# Patient Record
Sex: Female | Born: 1935 | Race: White | Hispanic: No | Smoking: Never smoker
Health system: Southern US, Community
[De-identification: ages and names within clinical notes are randomized; demographics above are authoritative.]

## PROBLEM LIST (undated history)

## (undated) DIAGNOSIS — M1990 Unspecified osteoarthritis, unspecified site: Secondary | ICD-10-CM

## (undated) DIAGNOSIS — I480 Paroxysmal atrial fibrillation: Secondary | ICD-10-CM

## (undated) DIAGNOSIS — D649 Anemia, unspecified: Secondary | ICD-10-CM

## (undated) DIAGNOSIS — I4891 Unspecified atrial fibrillation: Secondary | ICD-10-CM

## (undated) DIAGNOSIS — K219 Gastro-esophageal reflux disease without esophagitis: Secondary | ICD-10-CM

## (undated) HISTORY — DX: Anemia, unspecified: D64.9

## (undated) HISTORY — DX: Unspecified atrial fibrillation: I48.91

## (undated) HISTORY — DX: Paroxysmal atrial fibrillation: I48.0

## (undated) HISTORY — PX: TONSILLECTOMY: SUR1361

## (undated) HISTORY — DX: Gastro-esophageal reflux disease without esophagitis: K21.9

## (undated) HISTORY — DX: Unspecified osteoarthritis, unspecified site: M19.90

## (undated) HISTORY — PX: ABDOMINAL HYSTERECTOMY: SHX81

---

## 2015-02-12 ENCOUNTER — Encounter (INDEPENDENT_AMBULATORY_CARE_PROVIDER_SITE_OTHER): Payer: Self-pay | Admitting: Ophthalmology

## 2015-02-16 ENCOUNTER — Encounter (INDEPENDENT_AMBULATORY_CARE_PROVIDER_SITE_OTHER): Payer: Medicare Other | Admitting: Ophthalmology

## 2015-02-16 DIAGNOSIS — H43813 Vitreous degeneration, bilateral: Secondary | ICD-10-CM | POA: Diagnosis not present

## 2015-02-16 DIAGNOSIS — H3531 Nonexudative age-related macular degeneration: Secondary | ICD-10-CM

## 2015-12-29 NOTE — Patient Instructions (Signed)
Joyce Hogan  12/29/2015     @PREFPERIOPPHARMACY @   Your procedure is scheduled on 01/04/2016.  Report to [facility] at 12:00 P.M.  Call this number if you have problems the morning of surgery:  [phone]   Remember:  Do not eat food or drink liquids after midnight.  Take these medicines the morning of surgery with A SIP OF WATER    Do not wear jewelry, make-up or nail polish.  Do not wear lotions, powders, or perfumes.  You may wear deodorant.  Do not shave 48 hours prior to surgery.  Men may shave face and neck.  Do not bring valuables to the hospital.  [facility] is not responsible for any belongings or valuables.  Contacts, dentures or bridgework may not be worn into surgery.  Leave your suitcase in the car.  After surgery it may be brought to your room.  For patients admitted to the hospital, discharge time will be determined by your treatment team.  Patients discharged the day of surgery will not be allowed to drive home.    Please read over the following fact sheets that you were given. Anesthesia Post-op Instructions     PATIENT INSTRUCTIONS POST-ANESTHESIA  IMMEDIATELY FOLLOWING SURGERY:  Do not drive or operate machinery for the first twenty four hours after surgery.  Do not make any important decisions for twenty four hours after surgery or while taking narcotic pain medications or sedatives.  If you develop intractable nausea and vomiting or a severe headache please notify your doctor immediately.  FOLLOW-UP:  Please make an appointment with your surgeon as instructed. You do not need to follow up with anesthesia unless specifically instructed to do so.  WOUND CARE INSTRUCTIONS (if applicable):  Keep a dry clean dressing on the anesthesia/puncture wound site if there is drainage.  Once the wound has quit draining you may leave it open to air.  Generally you should leave the bandage intact for twenty four hours unless there is drainage.  If the epidural site  drains for more than 36-48 hours please call the anesthesia department.  QUESTIONS?:  Please feel free to call your physician or the hospital operator if you have any questions, and they will be happy to assist you.       A cataract is a clouding of the lens of the eye. When a lens becomes cloudy, vision is reduced based on the degree and nature of the clouding. Surgery may be needed to improve vision. Surgery removes the cloudy lens and usually replaces it with a substitute lens (intraocular lens, IOL). LET YOUR EYE DOCTOR KNOW ABOUT:  Allergies to food or medicine.  Medicines taken including herbs, eye drops, over-the-counter medicines, and creams.  Use of steroids (by mouth or creams).  Previous problems with anesthetics or numbing medicine.  History of bleeding problems or blood clots.  Previous surgery.  Other health problems, including diabetes and kidney problems.  Possibility of pregnancy, if this applies. RISKS AND COMPLICATIONS  Infection.  Inflammation of the eyeball (endophthalmitis) that can spread to both eyes (sympathetic ophthalmia).  Poor wound healing.  If an IOL is inserted, it can later fall out of proper position. This is very uncommon.  Clouding of the part of your eye that holds an IOL in place. This is called an "after-cataract." These are uncommon but easily treated. BEFORE THE PROCEDURE  Do not eat or drink anything except small amounts of water for 8 to 12 before your surgery, or as  directed by your caregiver.  Unless you are told otherwise, continue any eye drops you have been prescribed.  Talk to your primary caregiver about all other medicines that you take (both prescription and nonprescription). In some cases, you may need to stop or change medicines near the time of your surgery. This is most important if you are taking blood-thinning medicine.Do not stop medicines unless you are told to do so.  Arrange for someone to drive you to and from  the procedure.  Do not put contact lenses in either eye on the day of your surgery. PROCEDURE There is more than one method for safely removing a cataract. Your doctor can explain the differences and help determine which is best for you. Phacoemulsification surgery is the most common form of cataract surgery.  An injection is given behind the eye or eye drops are given to make this a painless procedure.  A small cut (incision) is made on the edge of the clear, dome-shaped surface that covers the front of the eye (cornea).  A tiny probe is painlessly inserted into the eye. This device gives off ultrasound waves that soften and break up the cloudy center of the lens. This makes it easier for the cloudy lens to be removed by suction.  An IOL may be implanted.  The normal lens of the eye is covered by a clear capsule. Part of that capsule is intentionally left in the eye to support the IOL.  Your surgeon may or may not use stitches to close the incision. There are other forms of cataract surgery that require a larger incision and stitches to close the eye. This approach is taken in cases where the doctor feels that the cataract cannot be easily removed using phacoemulsification. AFTER THE PROCEDURE  When an IOL is implanted, it does not need care. It becomes a permanent part of your eye and cannot be seen or felt.  Your doctor will schedule follow-up exams to check on your progress.  Review your other medicines with your doctor to see which can be resumed after surgery.  Use eye drops or take medicine as prescribed by your doctor.   This information is not intended to replace advice given to you by your health care provider. Make sure you discuss any questions you have with your health care provider.   Document Released: 09/01/2011 Document Revised: 10/03/2014 Document Reviewed: 09/01/2011 Elsevier Interactive Patient Education [employer].

## 2015-12-30 ENCOUNTER — Encounter (HOSPITAL_COMMUNITY)
Admission: RE | Admit: 2015-12-30 | Discharge: 2015-12-30 | Disposition: A | Discharge status: Home / Self Care | Payer: Medicare Other | Source: Ambulatory Visit | Attending: Ophthalmology | Admitting: Ophthalmology

## 2015-12-30 ENCOUNTER — Other Ambulatory Visit: Payer: Self-pay

## 2015-12-30 ENCOUNTER — Encounter (HOSPITAL_COMMUNITY): Payer: Self-pay

## 2015-12-30 DIAGNOSIS — I452 Bifascicular block: Secondary | ICD-10-CM | POA: Diagnosis not present

## 2015-12-30 DIAGNOSIS — I498 Other specified cardiac arrhythmias: Secondary | ICD-10-CM | POA: Insufficient documentation

## 2015-12-30 DIAGNOSIS — Z01818 Encounter for other preprocedural examination: Secondary | ICD-10-CM | POA: Diagnosis not present

## 2015-12-30 DIAGNOSIS — Z01812 Encounter for preprocedural laboratory examination: Secondary | ICD-10-CM | POA: Insufficient documentation

## 2015-12-30 DIAGNOSIS — H2511 Age-related nuclear cataract, right eye: Secondary | ICD-10-CM | POA: Diagnosis not present

## 2015-12-30 LAB — BASIC METABOLIC PANEL
ANION GAP: 8 (ref 5–15)
BUN: 15 mg/dL (ref 6–20)
CO2: 25 mmol/L (ref 22–32)
Calcium: 8.6 mg/dL — ABNORMAL LOW (ref 8.9–10.3)
Chloride: 106 mmol/L (ref 101–111)
Creatinine, Ser: 0.91 mg/dL (ref 0.44–1.00)
GFR calc Af Amer: >60 mL/min (ref >60)
GFR, EST NON AFRICAN AMERICAN: 55 mL/min — AB (ref >60)
GLUCOSE: 172 mg/dL — AB (ref 65–99)
POTASSIUM: 3.7 mmol/L (ref 3.5–5.1)
Sodium: 139 mmol/L (ref 135–145)

## 2015-12-30 LAB — CBC
HEMATOCRIT: 35.9 % — AB (ref 36.0–46.0)
Hemoglobin: 11.6 g/dL — ABNORMAL LOW (ref 12.0–15.0)
MCH: 30.3 pg (ref 26.0–34.0)
MCHC: 32.3 g/dL (ref 30.0–36.0)
MCV: 93.7 fL (ref 78.0–100.0)
PLATELETS: 219 10*3/uL (ref 150–400)
RBC: 3.83 MIL/uL — AB (ref 3.87–5.11)
RDW: 13.5 % (ref 11.5–15.5)
WBC: 6.4 10*3/uL (ref 4.0–10.5)

## 2015-12-30 NOTE — Pre-Procedure Instructions (Signed)
Patient given information to sign up for my chart at home. 

## 2015-12-31 NOTE — Pre-Procedure Instructions (Signed)
EKG shown to Dr Gonzalez. No orders given. 

## 2016-01-04 ENCOUNTER — Ambulatory Visit (HOSPITAL_COMMUNITY): Payer: Medicare Other | Admitting: Anesthesiology

## 2016-01-04 ENCOUNTER — Encounter (HOSPITAL_COMMUNITY): Payer: Self-pay | Admitting: *Deleted

## 2016-01-04 ENCOUNTER — Encounter (HOSPITAL_COMMUNITY)
Admission: RE | Disposition: A | Discharge status: Home / Self Care | Payer: Self-pay | Source: Ambulatory Visit | Attending: Ophthalmology

## 2016-01-04 ENCOUNTER — Ambulatory Visit (HOSPITAL_COMMUNITY)
Admission: RE | Admit: 2016-01-04 | Discharge: 2016-01-04 | Disposition: A | Discharge status: Home / Self Care | Payer: Medicare Other | Source: Ambulatory Visit | Attending: Ophthalmology | Admitting: Ophthalmology

## 2016-01-04 DIAGNOSIS — H2511 Age-related nuclear cataract, right eye: Secondary | ICD-10-CM | POA: Insufficient documentation

## 2016-01-04 HISTORY — PX: CATARACT EXTRACTION W/PHACO: SHX586

## 2016-01-04 SURGERY — PHACOEMULSIFICATION, CATARACT, WITH IOL INSERTION
Anesthesia: Monitor Anesthesia Care | Site: Eye | Laterality: Right

## 2016-01-04 MED ORDER — PROVISC 10 MG/ML IO SOLN
INTRAOCULAR | Status: DC | PRN
  Administered 2016-01-04: 0.85 mL via INTRAOCULAR

## 2016-01-04 MED ORDER — EPINEPHRINE HCL 1 MG/ML IJ SOLN
INTRAMUSCULAR | Status: AC
  Filled 2016-01-04: qty 1

## 2016-01-04 MED ORDER — LACTATED RINGERS IV SOLN
INTRAVENOUS | Status: DC
  Administered 2016-01-04: 1000 mL via INTRAVENOUS

## 2016-01-04 MED ORDER — PHENYLEPHRINE HCL 2.5 % OP SOLN
1.0000 [drp] | OPHTHALMIC | Status: AC
  Administered 2016-01-04 (×3): 1 [drp] via OPHTHALMIC

## 2016-01-04 MED ORDER — POVIDONE-IODINE 5 % OP SOLN
OPHTHALMIC | Status: DC | PRN
  Administered 2016-01-04: 1 via OPHTHALMIC

## 2016-01-04 MED ORDER — BSS IO SOLN
INTRAOCULAR | Status: DC | PRN
  Administered 2016-01-04: 15 mL

## 2016-01-04 MED ORDER — FENTANYL CITRATE (PF) 100 MCG/2ML IJ SOLN
INTRAMUSCULAR | Status: AC
  Filled 2016-01-04: qty 2

## 2016-01-04 MED ORDER — LIDOCAINE 3.5 % OP GEL OPTIME - NO CHARGE
OPHTHALMIC | Status: DC | PRN
  Administered 2016-01-04: 1 [drp] via OPHTHALMIC

## 2016-01-04 MED ORDER — TETRACAINE HCL 0.5 % OP SOLN
1.0000 [drp] | OPHTHALMIC | Status: AC
Start: 2016-01-04 — End: 2016-01-04
  Administered 2016-01-04 (×3): 1 [drp] via OPHTHALMIC

## 2016-01-04 MED ORDER — CYCLOPENTOLATE-PHENYLEPHRINE 0.2-1 % OP SOLN
1.0000 [drp] | OPHTHALMIC | Status: AC
  Administered 2016-01-04 (×3): 1 [drp] via OPHTHALMIC

## 2016-01-04 MED ORDER — NEOMYCIN-POLYMYXIN-DEXAMETH 3.5-10000-0.1 OP SUSP
OPHTHALMIC | Status: DC | PRN
  Administered 2016-01-04: 2 [drp] via OPHTHALMIC

## 2016-01-04 MED ORDER — MIDAZOLAM HCL 2 MG/2ML IJ SOLN
1.0000 mg | INTRAMUSCULAR | Status: DC | PRN
  Administered 2016-01-04: 2 mg via INTRAVENOUS
  Filled 2016-01-04: qty 2

## 2016-01-04 MED ORDER — EPINEPHRINE HCL 1 MG/ML IJ SOLN
INTRAOCULAR | Status: DC | PRN
  Administered 2016-01-04: 500 mL

## 2016-01-04 MED ORDER — LIDOCAINE HCL (PF) 1 % IJ SOLN
INTRAOCULAR | Status: DC | PRN
  Administered 2016-01-04: .4 mL via OPHTHALMIC

## 2016-01-04 MED ORDER — LIDOCAINE HCL 3.5 % OP GEL: 1.0000 "application " | Freq: Once | OPHTHALMIC | Status: DC

## 2016-01-04 MED ORDER — FENTANYL CITRATE (PF) 100 MCG/2ML IJ SOLN
25.0000 ug | Freq: Once | INTRAMUSCULAR | Status: AC
  Administered 2016-01-04: 25 ug via INTRAVENOUS

## 2016-01-04 SURGICAL SUPPLY — 23 items
CAPSULAR TENSION RING-AMO (OPHTHALMIC RELATED) IMPLANT
CLOTH BEACON ORANGE TIMEOUT ST (SAFETY) ×3 IMPLANT
EYE SHIELD UNIVERSAL CLEAR (GAUZE/BANDAGES/DRESSINGS) ×3 IMPLANT
GLOVE BIOGEL PI IND STRL 7.0 (GLOVE) ×1 IMPLANT
GLOVE BIOGEL PI IND STRL 7.5 (GLOVE) IMPLANT
GLOVE BIOGEL PI INDICATOR 7.0 (GLOVE) ×2
GLOVE BIOGEL PI INDICATOR 7.5 (GLOVE)
GLOVE EXAM NITRILE LRG STRL (GLOVE) IMPLANT
GLOVE EXAM NITRILE MD LF STRL (GLOVE) ×3 IMPLANT
KIT VITRECTOMY (OPHTHALMIC RELATED) IMPLANT
LENS IOL ACRYSOF IQ TORIC 15.0 ×3 IMPLANT
PAD ARMBOARD 7.5X6 YLW CONV (MISCELLANEOUS) ×3 IMPLANT
PROC W NO LENS (INTRAOCULAR LENS)
PROC W SPEC LENS (INTRAOCULAR LENS) ×3
PROCESS W NO LENS (INTRAOCULAR LENS) IMPLANT
PROCESS W SPEC LENS (INTRAOCULAR LENS) ×1 IMPLANT
RETRACTOR IRIS SIGHTPATH (OPHTHALMIC RELATED) IMPLANT
RING MALYGIN (MISCELLANEOUS) IMPLANT
SYRINGE LUER LOK 1CC (MISCELLANEOUS) ×3 IMPLANT
TAPE SURG TRANSPORE 1 IN (GAUZE/BANDAGES/DRESSINGS) ×1 IMPLANT
TAPE SURGICAL TRANSPORE 1 IN (GAUZE/BANDAGES/DRESSINGS) ×2
VISCOELASTIC ADDITIONAL (OPHTHALMIC RELATED) IMPLANT
WATER STERILE IRR 250ML POUR (IV SOLUTION) ×3 IMPLANT

## 2016-01-04 NOTE — Op Note (Signed)
Date of Admission: 01/04/2016  Date of Surgery: 01/04/2016  Pre-Op Dx: Cataract Right Eye  Post-Op Dx: Senile Nuclear Cataract Right Eye,  Dx Code H25.11, Astigmatism Right Eye, Dx Code H52.2  Surgeon: Tonny Branch, M.D.  Assistants: None  Anesthesia: Topical with MAC  Indications: Painless, progressive loss of vision with compromise of daily activities.  Surgery: Cataract Extraction with Intraocular lens Implant Right Eye  Discription: The patient had dilating drops and viscous lidocaine placed into the Right in the pre-op holding area. In the sitting position horizontal reference marks were made on the cornea.  After transfer to the operating room, a time out was performed. The patient was then prepped and draped. Beginning with a 24 degree blade a paracentesis port was made at the surgeon's 2 o'clock position. The anterior chamber was then filled with 1% non-preserved lidocaine. This was followed by filling the anterior chamber with Provisc. A 2.47mm keratome blade was used to make a clear cornea incision at the temporal limbus. A bent cystatome needle was used to create a continuous tear capsulotomy. Hydrodissection was performed with balanced salt solution on a Fine canula. The lens nucleus was then removed using the phacoemulsification handpiece. Residual cortex was removed with the I&A handpiece. The anterior chamber and capsular bag were refilled with Provisc. A posterior chamber intraocular lens was placed into the capsular bag with it's injector. Additional corneal marks were made on the 172/356 degree meridians.  The Provisc was then removed from the anterior chamber and capsular bag with the I&A handpiece. The implant was positioned with the Kuglan hook. Stromal hydration of the main incision and paracentesis port was performed with BSS on a Fine canula. The wounds were tested for leak which was negative. The patient tolerated the procedure well. There were no operative complications. The  patient was then transferred to the recovery room in stable condition.  Complications: None  Specimen: None  EBL: None  Prosthetic device: Abbott Technis Toric ZCT400, power23.0,  SN [id number].

## 2016-01-04 NOTE — Discharge Instructions (Signed)
Anesthesia, Adult, Care After °Refer to this sheet in the next few weeks. These instructions provide you with information on caring for yourself after your procedure. Your health care provider may also give you more specific instructions. Your treatment has been planned according to current medical practices, but problems sometimes occur. Call your health care provider if you have any problems or questions after your procedure. °WHAT TO EXPECT AFTER THE PROCEDURE °After the procedure, it is typical to experience: °· Sleepiness. °· Nausea and vomiting. °HOME CARE INSTRUCTIONS °· For the first 24 hours after general anesthesia: °¨ Have a responsible person with you. °¨ Do not drive a car. If you are alone, do not take public transportation. °¨ Do not drink alcohol. °¨ Do not take medicine that has not been prescribed by your health care provider. °¨ Do not sign important papers or make important decisions. °¨ You may resume a normal diet and activities as directed by your health care provider. °· Change bandages (dressings) as directed. °· If you have questions or problems that seem related to general anesthesia, call the hospital and ask for the anesthetist or anesthesiologist on call. °SEEK MEDICAL CARE IF: °· You have nausea and vomiting that continue the day after anesthesia. °· You develop a rash. °SEEK IMMEDIATE MEDICAL CARE IF:  °· You have difficulty breathing. °· You have chest pain. °· You have any allergic problems. °  °This information is not intended to replace advice given to you by your health care provider. Make sure you discuss any questions you have with your health care provider. °  °Document Released: 12/19/2000 Document Revised: 10/03/2014 Document Reviewed: 01/11/2012 °Elsevier Interactive Patient Education ©2016 Elsevier Inc. ° °

## 2016-01-04 NOTE — Anesthesia Postprocedure Evaluation (Signed)
Anesthesia Post Note  Patient: Joyce Hogan  Procedure(s) Performed: Procedure(s) (LRB): CATARACT EXTRACTION PHACO AND INTRAOCULAR LENS PLACEMENT (IOC) (Right)  Patient location during evaluation: Short Stay Anesthesia Type: MAC Level of consciousness: awake and alert and oriented Pain management: pain level controlled Respiratory status: spontaneous breathing, nonlabored ventilation and respiratory function stable Cardiovascular status: blood pressure returned to baseline Postop Assessment: no signs of nausea or vomiting and adequate PO intake Anesthetic complications: no    Last Vitals:  Filed Vitals:   01/04/16 0835 01/04/16 0840  BP: 128/60 141/67  Pulse:    Temp:    Resp: 16 15    Last Pain: There were no vitals filed for this visit.               Havanna Groner J

## 2016-01-04 NOTE — Anesthesia Preprocedure Evaluation (Signed)
Anesthesia Evaluation  Patient identified by MRN, date of birth, ID band Patient awake    Reviewed: Allergy & Precautions, NPO status , Patient's Chart, lab work & pertinent test results  Airway Mallampati: III  TM Distance: <3 FB     Dental  (+) Teeth Intact   Pulmonary neg pulmonary ROS,    breath sounds clear to auscultation       Cardiovascular negative cardio ROS   Rhythm:Regular Rate:Normal     Neuro/Psych    GI/Hepatic negative GI ROS,   Endo/Other    Renal/GU      Musculoskeletal   Abdominal   Peds  Hematology   Anesthesia Other Findings   Reproductive/Obstetrics                             Anesthesia Physical Anesthesia Plan  ASA: II  Anesthesia Plan: MAC   Post-op Pain Management:    Induction: Intravenous  Airway Management Planned: Nasal Cannula  Additional Equipment:   Intra-op Plan:   Post-operative Plan:   Informed Consent: I have reviewed the patients History and Physical, chart, labs and discussed the procedure including the risks, benefits and alternatives for the proposed anesthesia with the patient or authorized representative who has indicated his/her understanding and acceptance.     Plan Discussed with:   Anesthesia Plan Comments:         Anesthesia Quick Evaluation

## 2016-01-04 NOTE — Transfer of Care (Signed)
Immediate Anesthesia Transfer of Care Note  Patient: Joyce Hogan  Procedure(s) Performed: Procedure(s) with comments: CATARACT EXTRACTION PHACO AND INTRAOCULAR LENS PLACEMENT (IOC) (Right) - CDE 9.06  Patient Location: Short Stay  Anesthesia Type:MAC  Level of Consciousness: awake, alert , oriented and patient cooperative  Airway & Oxygen Therapy: Patient Spontanous Breathing  Post-op Assessment: Report given to RN, Post -op Vital signs reviewed and stable and Patient moving all extremities  Post vital signs: Reviewed and stable  Last Vitals:  Filed Vitals:   01/04/16 0835 01/04/16 0840  BP: 128/60 141/67  Pulse:    Temp:    Resp: 16 15    Complications: No apparent anesthesia complications

## 2016-01-04 NOTE — H&P (Signed)
I have reviewed the H&P, the patient was re-examined, and I have identified no interval changes in medical condition and plan of care since the history and physical of record  

## 2016-01-05 ENCOUNTER — Encounter (HOSPITAL_COMMUNITY): Payer: Self-pay | Admitting: Ophthalmology

## 2016-01-15 ENCOUNTER — Encounter (HOSPITAL_COMMUNITY): Payer: Self-pay

## 2016-01-15 ENCOUNTER — Encounter (HOSPITAL_COMMUNITY)
Admission: RE | Admit: 2016-01-15 | Discharge: 2016-01-15 | Disposition: A | Discharge status: Home / Self Care | Payer: Medicare Other | Source: Ambulatory Visit | Attending: Ophthalmology | Admitting: Ophthalmology

## 2016-01-21 ENCOUNTER — Encounter (HOSPITAL_COMMUNITY)
Admission: RE | Disposition: A | Discharge status: Home / Self Care | Payer: Self-pay | Source: Ambulatory Visit | Attending: Ophthalmology

## 2016-01-21 ENCOUNTER — Ambulatory Visit (HOSPITAL_COMMUNITY): Payer: Medicare Other | Admitting: Anesthesiology

## 2016-01-21 ENCOUNTER — Ambulatory Visit (HOSPITAL_COMMUNITY)
Admission: RE | Admit: 2016-01-21 | Discharge: 2016-01-21 | Disposition: A | Discharge status: Home / Self Care | Payer: Medicare Other | Source: Ambulatory Visit | Attending: Ophthalmology | Admitting: Ophthalmology

## 2016-01-21 ENCOUNTER — Encounter (HOSPITAL_COMMUNITY): Payer: Self-pay | Admitting: *Deleted

## 2016-01-21 DIAGNOSIS — H52202 Unspecified astigmatism, left eye: Secondary | ICD-10-CM | POA: Insufficient documentation

## 2016-01-21 DIAGNOSIS — H2512 Age-related nuclear cataract, left eye: Secondary | ICD-10-CM | POA: Diagnosis not present

## 2016-01-21 HISTORY — PX: CATARACT EXTRACTION W/PHACO: SHX586

## 2016-01-21 SURGERY — PHACOEMULSIFICATION, CATARACT, WITH IOL INSERTION
Anesthesia: Monitor Anesthesia Care | Site: Eye | Laterality: Left

## 2016-01-21 MED ORDER — LIDOCAINE 3.5 % OP GEL OPTIME - NO CHARGE
OPHTHALMIC | Status: DC | PRN
  Administered 2016-01-21: 1 [drp] via OPHTHALMIC

## 2016-01-21 MED ORDER — MIDAZOLAM HCL 2 MG/2ML IJ SOLN
1.0000 mg | INTRAMUSCULAR | Status: DC | PRN
  Administered 2016-01-21: 2 mg via INTRAVENOUS

## 2016-01-21 MED ORDER — POVIDONE-IODINE 5 % OP SOLN
OPHTHALMIC | Status: DC | PRN
  Administered 2016-01-21: 1 via OPHTHALMIC

## 2016-01-21 MED ORDER — TETRACAINE HCL 0.5 % OP SOLN
1.0000 [drp] | OPHTHALMIC | Status: AC
  Administered 2016-01-21 (×3): 1 [drp] via OPHTHALMIC

## 2016-01-21 MED ORDER — MIDAZOLAM HCL 2 MG/2ML IJ SOLN
INTRAMUSCULAR | Status: AC
  Filled 2016-01-21: qty 2

## 2016-01-21 MED ORDER — FENTANYL CITRATE (PF) 100 MCG/2ML IJ SOLN
25.0000 ug | INTRAMUSCULAR | Status: AC
  Administered 2016-01-21 (×2): 25 ug via INTRAVENOUS

## 2016-01-21 MED ORDER — BSS IO SOLN
INTRAOCULAR | Status: DC | PRN
  Administered 2016-01-21: 15 mL via INTRAOCULAR

## 2016-01-21 MED ORDER — LIDOCAINE HCL (PF) 1 % IJ SOLN
INTRAMUSCULAR | Status: DC | PRN
  Administered 2016-01-21: .7 mL

## 2016-01-21 MED ORDER — CYCLOPENTOLATE-PHENYLEPHRINE 0.2-1 % OP SOLN
1.0000 [drp] | OPHTHALMIC | Status: AC
  Administered 2016-01-21 (×2): 1 [drp] via OPHTHALMIC

## 2016-01-21 MED ORDER — PROVISC 10 MG/ML IO SOLN
INTRAOCULAR | Status: DC | PRN
  Administered 2016-01-21: 0.85 mL via INTRAOCULAR

## 2016-01-21 MED ORDER — LACTATED RINGERS IV SOLN
INTRAVENOUS | Status: DC
Start: 2016-01-21 — End: 2016-01-21
  Administered 2016-01-21: 09:00:00 via INTRAVENOUS

## 2016-01-21 MED ORDER — PHENYLEPHRINE HCL 2.5 % OP SOLN
1.0000 [drp] | OPHTHALMIC | Status: AC
  Administered 2016-01-21 (×3): 1 [drp] via OPHTHALMIC

## 2016-01-21 MED ORDER — EPINEPHRINE HCL 1 MG/ML IJ SOLN
INTRAOCULAR | Status: DC | PRN
  Administered 2016-01-21: 500 mL

## 2016-01-21 MED ORDER — FENTANYL CITRATE (PF) 100 MCG/2ML IJ SOLN
INTRAMUSCULAR | Status: AC
  Filled 2016-01-21: qty 2

## 2016-01-21 MED ORDER — EPINEPHRINE HCL 1 MG/ML IJ SOLN
INTRAMUSCULAR | Status: AC
  Filled 2016-01-21: qty 1

## 2016-01-21 MED ORDER — NEOMYCIN-POLYMYXIN-DEXAMETH 3.5-10000-0.1 OP SUSP
OPHTHALMIC | Status: DC | PRN
  Administered 2016-01-21: 2 [drp] via OPHTHALMIC

## 2016-01-21 MED ORDER — LIDOCAINE HCL 3.5 % OP GEL: 1.0000 "application " | Freq: Once | OPHTHALMIC | Status: DC

## 2016-01-21 SURGICAL SUPPLY — 23 items
CAPSULAR TENSION RING-AMO (OPHTHALMIC RELATED) IMPLANT
CLOTH BEACON ORANGE TIMEOUT ST (SAFETY) ×3 IMPLANT
EYE SHIELD UNIVERSAL CLEAR (GAUZE/BANDAGES/DRESSINGS) ×3 IMPLANT
GLOVE BIOGEL PI IND STRL 7.0 (GLOVE) ×1 IMPLANT
GLOVE BIOGEL PI IND STRL 7.5 (GLOVE) IMPLANT
GLOVE BIOGEL PI INDICATOR 7.0 (GLOVE) ×2
GLOVE BIOGEL PI INDICATOR 7.5 (GLOVE)
GLOVE EXAM NITRILE LRG STRL (GLOVE) IMPLANT
GLOVE EXAM NITRILE MD LF STRL (GLOVE) ×3 IMPLANT
KIT VITRECTOMY (OPHTHALMIC RELATED) IMPLANT
LENS IOL ACRYSOF IQ TORIC 15.0 ×3 IMPLANT
PAD ARMBOARD 7.5X6 YLW CONV (MISCELLANEOUS) ×3 IMPLANT
PROC W NO LENS (INTRAOCULAR LENS)
PROC W SPEC LENS (INTRAOCULAR LENS) ×3
PROCESS W NO LENS (INTRAOCULAR LENS) IMPLANT
PROCESS W SPEC LENS (INTRAOCULAR LENS) ×1 IMPLANT
RETRACTOR IRIS SIGHTPATH (OPHTHALMIC RELATED) IMPLANT
RING MALYGIN (MISCELLANEOUS) IMPLANT
SYRINGE LUER LOK 1CC (MISCELLANEOUS) ×3 IMPLANT
TAPE SURG TRANSPORE 1 IN (GAUZE/BANDAGES/DRESSINGS) ×1 IMPLANT
TAPE SURGICAL TRANSPORE 1 IN (GAUZE/BANDAGES/DRESSINGS) ×2
VISCOELASTIC ADDITIONAL (OPHTHALMIC RELATED) IMPLANT
WATER STERILE IRR 250ML POUR (IV SOLUTION) ×3 IMPLANT

## 2016-01-21 NOTE — Anesthesia Postprocedure Evaluation (Signed)
Anesthesia Post Note  Patient: Joyce Hogan  Procedure(s) Performed: Procedure(s) (LRB): CATARACT EXTRACTION PHACO AND INTRAOCULAR LENS PLACEMENT (IOC) (Left)  Patient location during evaluation: Short Stay Anesthesia Type: MAC Level of consciousness: awake and alert, oriented and patient cooperative Pain management: pain level controlled Vital Signs Assessment: post-procedure vital signs reviewed and stable Respiratory status: spontaneous breathing, nonlabored ventilation and respiratory function stable Cardiovascular status: blood pressure returned to baseline Postop Assessment: no signs of nausea or vomiting Anesthetic complications: no    Last Vitals:  Filed Vitals:   01/21/16 0930 01/21/16 0935  BP: 155/78 134/82  Resp: 30 18    Last Pain: There were no vitals filed for this visit.               Priyanka Causey J

## 2016-01-21 NOTE — Transfer of Care (Signed)
Immediate Anesthesia Transfer of Care Note  Patient: Joyce Hogan  Procedure(s) Performed: Procedure(s) with comments: CATARACT EXTRACTION PHACO AND INTRAOCULAR LENS PLACEMENT (IOC) (Left) - CDE: 12.65  Patient Location: Short Stay  Anesthesia Type:MAC  Level of Consciousness: awake, alert , oriented and patient cooperative  Airway & Oxygen Therapy: Patient Spontanous Breathing  Post-op Assessment: Report given to RN, Post -op Vital signs reviewed and stable and Patient moving all extremities  Post vital signs: Reviewed and stable  Last Vitals:  Filed Vitals:   01/21/16 0930 01/21/16 0935  BP: 155/78 134/82  Resp: 30 18    Last Pain: There were no vitals filed for this visit.    Patients Stated Pain Goal: 7 ([id number] [id number])  Complications: No apparent anesthesia complications

## 2016-01-21 NOTE — Anesthesia Preprocedure Evaluation (Signed)
Anesthesia Evaluation  Patient identified by MRN, date of birth, ID band Patient awake    Reviewed: Allergy & Precautions, NPO status , Patient's Chart, lab work & pertinent test results  Airway Mallampati: III  TM Distance: <3 FB     Dental  (+) Teeth Intact   Pulmonary neg pulmonary ROS,    breath sounds clear to auscultation       Cardiovascular negative cardio ROS   Rhythm:Regular Rate:Normal     Neuro/Psych    GI/Hepatic negative GI ROS,   Endo/Other    Renal/GU      Musculoskeletal   Abdominal   Peds  Hematology   Anesthesia Other Findings   Reproductive/Obstetrics                             Anesthesia Physical Anesthesia Plan  ASA: II  Anesthesia Plan: MAC   Post-op Pain Management:    Induction: Intravenous  Airway Management Planned: Nasal Cannula  Additional Equipment:   Intra-op Plan:   Post-operative Plan:   Informed Consent: I have reviewed the patients History and Physical, chart, labs and discussed the procedure including the risks, benefits and alternatives for the proposed anesthesia with the patient or authorized representative who has indicated his/her understanding and acceptance.     Plan Discussed with:   Anesthesia Plan Comments:         Anesthesia Quick Evaluation

## 2016-01-21 NOTE — H&P (Signed)
I have reviewed the H&P, the patient was re-examined, and I have identified no interval changes in medical condition and plan of care since the history and physical of record  

## 2016-01-21 NOTE — Discharge Instructions (Signed)

## 2016-01-21 NOTE — Op Note (Signed)
Date of Admission: 01/21/2016  Date of Surgery: 01/21/2016  Pre-Op Dx: Cataract Left Eye  Post-Op Dx: Senile Nuclear Cataract Left Eye,  Dx Code H25.12, Astigmatism Left Eye, Dx Code H52.2  Surgeon: Tonny Branch, M.D.  Assistants: None  Anesthesia: Topical with MAC  Indications: Painless, progressive loss of vision with compromise of daily activities.  Surgery: Cataract Extraction with Intraocular lens Implant Left Eye  Discription: The patient had dilating drops and viscous lidocaine placed into the Left in the pre-op holding area. In the sitting position horizontal reference marks were made on the cornea.  After transfer to the operating room, a time out was performed. The patient was then prepped and draped. Beginning with a 42 degree blade a paracentesis port was made at the surgeon's 2 o'clock position. The anterior chamber was then filled with 1% non-preserved lidocaine. This was followed by filling the anterior chamber with Provisc. A 2.45mm keratome blade was used to make a clear cornea incision at the temporal limbus. A bent cystatome needle was used to create a continuous tear capsulotomy. Hydrodissection was performed with balanced salt solution on a Fine canula. The lens nucleus was then removed using the phacoemulsification handpiece. Residual cortex was removed with the I&A handpiece. The anterior chamber and capsular bag were refilled with Provisc. A posterior chamber intraocular lens was placed into the capsular bag with it's injector. Additional corneal marks were made on the 167/347 degree meridians.  The Provisc was then removed from the anterior chamber and capsular bag with the I&A handpiece. The implant was positioned with the Kuglan hook. Stromal hydration of the main incision and paracentesis port was performed with BSS on a Fine canula. The wounds were tested for leak which was negative. The patient tolerated the procedure well. There were no operative complications. The patient  was then transferred to the recovery room in stable condition.  Complications: None  Specimen: None  EBL: None  Prosthetic device: Abbott Technis Toric ZCT300, power 23.0,  SN [id number].

## 2016-01-22 ENCOUNTER — Encounter (HOSPITAL_COMMUNITY): Payer: Self-pay | Admitting: Ophthalmology

## 2016-02-26 ENCOUNTER — Ambulatory Visit (INDEPENDENT_AMBULATORY_CARE_PROVIDER_SITE_OTHER): Payer: Medicare Other | Admitting: Ophthalmology

## 2016-02-26 DIAGNOSIS — H43813 Vitreous degeneration, bilateral: Secondary | ICD-10-CM

## 2016-02-26 DIAGNOSIS — H353134 Nonexudative age-related macular degeneration, bilateral, advanced atrophic with subfoveal involvement: Secondary | ICD-10-CM

## 2016-11-11 ENCOUNTER — Encounter (INDEPENDENT_AMBULATORY_CARE_PROVIDER_SITE_OTHER): Payer: Medicare Other | Admitting: Ophthalmology

## 2016-11-11 DIAGNOSIS — H353132 Nonexudative age-related macular degeneration, bilateral, intermediate dry stage: Secondary | ICD-10-CM | POA: Diagnosis not present

## 2016-11-11 DIAGNOSIS — H43813 Vitreous degeneration, bilateral: Secondary | ICD-10-CM

## 2016-11-11 DIAGNOSIS — H01002 Unspecified blepharitis right lower eyelid: Secondary | ICD-10-CM | POA: Diagnosis not present

## 2017-03-02 ENCOUNTER — Ambulatory Visit (INDEPENDENT_AMBULATORY_CARE_PROVIDER_SITE_OTHER): Payer: Medicare Other | Admitting: Ophthalmology

## 2017-07-31 DIAGNOSIS — L03032 Cellulitis of left toe: Secondary | ICD-10-CM | POA: Diagnosis not present

## 2017-07-31 DIAGNOSIS — L600 Ingrowing nail: Secondary | ICD-10-CM | POA: Diagnosis not present

## 2017-07-31 DIAGNOSIS — I739 Peripheral vascular disease, unspecified: Secondary | ICD-10-CM | POA: Diagnosis not present

## 2017-07-31 DIAGNOSIS — L03031 Cellulitis of right toe: Secondary | ICD-10-CM | POA: Diagnosis not present

## 2017-10-23 DIAGNOSIS — L600 Ingrowing nail: Secondary | ICD-10-CM | POA: Diagnosis not present

## 2017-10-23 DIAGNOSIS — I739 Peripheral vascular disease, unspecified: Secondary | ICD-10-CM | POA: Diagnosis not present

## 2017-10-23 DIAGNOSIS — L03031 Cellulitis of right toe: Secondary | ICD-10-CM | POA: Diagnosis not present

## 2017-10-23 DIAGNOSIS — L03032 Cellulitis of left toe: Secondary | ICD-10-CM | POA: Diagnosis not present

## 2017-11-13 ENCOUNTER — Ambulatory Visit (INDEPENDENT_AMBULATORY_CARE_PROVIDER_SITE_OTHER): Payer: Medicare Other | Admitting: Ophthalmology

## 2017-11-13 DIAGNOSIS — H353112 Nonexudative age-related macular degeneration, right eye, intermediate dry stage: Secondary | ICD-10-CM

## 2017-11-13 DIAGNOSIS — H353124 Nonexudative age-related macular degeneration, left eye, advanced atrophic with subfoveal involvement: Secondary | ICD-10-CM

## 2017-11-13 DIAGNOSIS — H43813 Vitreous degeneration, bilateral: Secondary | ICD-10-CM | POA: Diagnosis not present

## 2017-11-17 DIAGNOSIS — Z6823 Body mass index (BMI) 23.0-23.9, adult: Secondary | ICD-10-CM | POA: Diagnosis not present

## 2017-11-17 DIAGNOSIS — M818 Other osteoporosis without current pathological fracture: Secondary | ICD-10-CM | POA: Diagnosis not present

## 2018-01-15 DIAGNOSIS — L03031 Cellulitis of right toe: Secondary | ICD-10-CM | POA: Diagnosis not present

## 2018-01-15 DIAGNOSIS — I739 Peripheral vascular disease, unspecified: Secondary | ICD-10-CM | POA: Diagnosis not present

## 2018-01-15 DIAGNOSIS — L03032 Cellulitis of left toe: Secondary | ICD-10-CM | POA: Diagnosis not present

## 2018-01-15 DIAGNOSIS — L600 Ingrowing nail: Secondary | ICD-10-CM | POA: Diagnosis not present

## 2018-02-21 DIAGNOSIS — L570 Actinic keratosis: Secondary | ICD-10-CM | POA: Diagnosis not present

## 2018-02-21 DIAGNOSIS — D485 Neoplasm of uncertain behavior of skin: Secondary | ICD-10-CM | POA: Diagnosis not present

## 2018-04-09 DIAGNOSIS — I739 Peripheral vascular disease, unspecified: Secondary | ICD-10-CM | POA: Diagnosis not present

## 2018-04-09 DIAGNOSIS — L03032 Cellulitis of left toe: Secondary | ICD-10-CM | POA: Diagnosis not present

## 2018-04-09 DIAGNOSIS — L03031 Cellulitis of right toe: Secondary | ICD-10-CM | POA: Diagnosis not present

## 2018-04-09 DIAGNOSIS — L600 Ingrowing nail: Secondary | ICD-10-CM | POA: Diagnosis not present

## 2018-05-14 ENCOUNTER — Encounter (INDEPENDENT_AMBULATORY_CARE_PROVIDER_SITE_OTHER): Payer: Medicare Other | Admitting: Ophthalmology

## 2018-05-14 DIAGNOSIS — H26492 Other secondary cataract, left eye: Secondary | ICD-10-CM

## 2018-05-14 DIAGNOSIS — H353112 Nonexudative age-related macular degeneration, right eye, intermediate dry stage: Secondary | ICD-10-CM | POA: Diagnosis not present

## 2018-05-14 DIAGNOSIS — H43813 Vitreous degeneration, bilateral: Secondary | ICD-10-CM | POA: Diagnosis not present

## 2018-05-14 DIAGNOSIS — H353124 Nonexudative age-related macular degeneration, left eye, advanced atrophic with subfoveal involvement: Secondary | ICD-10-CM | POA: Diagnosis not present

## 2018-05-29 DIAGNOSIS — H26492 Other secondary cataract, left eye: Secondary | ICD-10-CM | POA: Diagnosis not present

## 2018-06-18 DIAGNOSIS — L03032 Cellulitis of left toe: Secondary | ICD-10-CM | POA: Diagnosis not present

## 2018-06-18 DIAGNOSIS — L600 Ingrowing nail: Secondary | ICD-10-CM | POA: Diagnosis not present

## 2018-06-18 DIAGNOSIS — I739 Peripheral vascular disease, unspecified: Secondary | ICD-10-CM | POA: Diagnosis not present

## 2018-06-18 DIAGNOSIS — L03031 Cellulitis of right toe: Secondary | ICD-10-CM | POA: Diagnosis not present

## 2018-09-10 DIAGNOSIS — L03032 Cellulitis of left toe: Secondary | ICD-10-CM | POA: Diagnosis not present

## 2018-09-10 DIAGNOSIS — L600 Ingrowing nail: Secondary | ICD-10-CM | POA: Diagnosis not present

## 2018-09-10 DIAGNOSIS — I739 Peripheral vascular disease, unspecified: Secondary | ICD-10-CM | POA: Diagnosis not present

## 2018-09-10 DIAGNOSIS — M79671 Pain in right foot: Secondary | ICD-10-CM | POA: Diagnosis not present

## 2018-09-10 DIAGNOSIS — L03031 Cellulitis of right toe: Secondary | ICD-10-CM | POA: Diagnosis not present

## 2018-11-19 ENCOUNTER — Encounter (INDEPENDENT_AMBULATORY_CARE_PROVIDER_SITE_OTHER): Payer: Medicare Other | Admitting: Ophthalmology

## 2018-11-19 DIAGNOSIS — H353134 Nonexudative age-related macular degeneration, bilateral, advanced atrophic with subfoveal involvement: Secondary | ICD-10-CM

## 2018-11-19 DIAGNOSIS — H43813 Vitreous degeneration, bilateral: Secondary | ICD-10-CM | POA: Diagnosis not present

## 2018-12-04 DIAGNOSIS — L03031 Cellulitis of right toe: Secondary | ICD-10-CM | POA: Diagnosis not present

## 2018-12-04 DIAGNOSIS — M79671 Pain in right foot: Secondary | ICD-10-CM | POA: Diagnosis not present

## 2018-12-04 DIAGNOSIS — L600 Ingrowing nail: Secondary | ICD-10-CM | POA: Diagnosis not present

## 2018-12-04 DIAGNOSIS — I739 Peripheral vascular disease, unspecified: Secondary | ICD-10-CM | POA: Diagnosis not present

## 2018-12-04 DIAGNOSIS — L03032 Cellulitis of left toe: Secondary | ICD-10-CM | POA: Diagnosis not present

## 2019-01-10 DIAGNOSIS — S91052A Open bite, left ankle, initial encounter: Secondary | ICD-10-CM | POA: Diagnosis not present

## 2019-01-10 DIAGNOSIS — Z6824 Body mass index (BMI) 24.0-24.9, adult: Secondary | ICD-10-CM | POA: Diagnosis not present

## 2019-01-10 DIAGNOSIS — M818 Other osteoporosis without current pathological fracture: Secondary | ICD-10-CM | POA: Diagnosis not present

## 2019-01-10 DIAGNOSIS — I10 Essential (primary) hypertension: Secondary | ICD-10-CM | POA: Diagnosis not present

## 2019-01-10 DIAGNOSIS — Z1389 Encounter for screening for other disorder: Secondary | ICD-10-CM | POA: Diagnosis not present

## 2019-01-10 DIAGNOSIS — Z0000 Encounter for general adult medical examination without abnormal findings: Secondary | ICD-10-CM | POA: Diagnosis not present

## 2019-04-16 DIAGNOSIS — I739 Peripheral vascular disease, unspecified: Secondary | ICD-10-CM | POA: Diagnosis not present

## 2019-04-16 DIAGNOSIS — L600 Ingrowing nail: Secondary | ICD-10-CM | POA: Diagnosis not present

## 2019-04-16 DIAGNOSIS — L03031 Cellulitis of right toe: Secondary | ICD-10-CM | POA: Diagnosis not present

## 2019-04-16 DIAGNOSIS — M79671 Pain in right foot: Secondary | ICD-10-CM | POA: Diagnosis not present

## 2019-04-16 DIAGNOSIS — L03032 Cellulitis of left toe: Secondary | ICD-10-CM | POA: Diagnosis not present

## 2019-05-14 DIAGNOSIS — H524 Presbyopia: Secondary | ICD-10-CM | POA: Diagnosis not present

## 2019-05-14 DIAGNOSIS — H353110 Nonexudative age-related macular degeneration, right eye, stage unspecified: Secondary | ICD-10-CM | POA: Diagnosis not present

## 2019-05-20 ENCOUNTER — Encounter (INDEPENDENT_AMBULATORY_CARE_PROVIDER_SITE_OTHER): Payer: Medicare Other | Admitting: Ophthalmology

## 2019-05-20 ENCOUNTER — Other Ambulatory Visit: Payer: Self-pay

## 2019-05-20 DIAGNOSIS — H43813 Vitreous degeneration, bilateral: Secondary | ICD-10-CM | POA: Diagnosis not present

## 2019-05-20 DIAGNOSIS — H353134 Nonexudative age-related macular degeneration, bilateral, advanced atrophic with subfoveal involvement: Secondary | ICD-10-CM | POA: Diagnosis not present

## 2019-06-19 ENCOUNTER — Encounter (INDEPENDENT_AMBULATORY_CARE_PROVIDER_SITE_OTHER): Payer: Medicare Other | Admitting: Ophthalmology

## 2019-06-19 ENCOUNTER — Other Ambulatory Visit: Payer: Self-pay

## 2019-06-19 DIAGNOSIS — H353132 Nonexudative age-related macular degeneration, bilateral, intermediate dry stage: Secondary | ICD-10-CM | POA: Diagnosis not present

## 2019-06-19 DIAGNOSIS — H43813 Vitreous degeneration, bilateral: Secondary | ICD-10-CM

## 2019-07-01 DIAGNOSIS — I739 Peripheral vascular disease, unspecified: Secondary | ICD-10-CM | POA: Diagnosis not present

## 2019-07-01 DIAGNOSIS — L03031 Cellulitis of right toe: Secondary | ICD-10-CM | POA: Diagnosis not present

## 2019-07-01 DIAGNOSIS — L600 Ingrowing nail: Secondary | ICD-10-CM | POA: Diagnosis not present

## 2019-07-01 DIAGNOSIS — L03032 Cellulitis of left toe: Secondary | ICD-10-CM | POA: Diagnosis not present

## 2019-07-01 DIAGNOSIS — M79671 Pain in right foot: Secondary | ICD-10-CM | POA: Diagnosis not present

## 2019-07-01 DIAGNOSIS — Z961 Presence of intraocular lens: Secondary | ICD-10-CM | POA: Diagnosis not present

## 2019-07-15 DIAGNOSIS — Z6823 Body mass index (BMI) 23.0-23.9, adult: Secondary | ICD-10-CM | POA: Diagnosis not present

## 2019-07-15 DIAGNOSIS — M818 Other osteoporosis without current pathological fracture: Secondary | ICD-10-CM | POA: Diagnosis not present

## 2019-07-15 DIAGNOSIS — Z0000 Encounter for general adult medical examination without abnormal findings: Secondary | ICD-10-CM | POA: Diagnosis not present

## 2019-07-15 DIAGNOSIS — I10 Essential (primary) hypertension: Secondary | ICD-10-CM | POA: Diagnosis not present

## 2019-08-20 DIAGNOSIS — H353130 Nonexudative age-related macular degeneration, bilateral, stage unspecified: Secondary | ICD-10-CM | POA: Diagnosis not present

## 2019-10-08 DIAGNOSIS — Z0389 Encounter for observation for other suspected diseases and conditions ruled out: Secondary | ICD-10-CM | POA: Diagnosis not present

## 2019-10-08 DIAGNOSIS — M810 Age-related osteoporosis without current pathological fracture: Secondary | ICD-10-CM | POA: Diagnosis not present

## 2019-10-14 DIAGNOSIS — M79671 Pain in right foot: Secondary | ICD-10-CM | POA: Diagnosis not present

## 2019-10-14 DIAGNOSIS — L03031 Cellulitis of right toe: Secondary | ICD-10-CM | POA: Diagnosis not present

## 2019-10-14 DIAGNOSIS — I739 Peripheral vascular disease, unspecified: Secondary | ICD-10-CM | POA: Diagnosis not present

## 2019-10-14 DIAGNOSIS — L03032 Cellulitis of left toe: Secondary | ICD-10-CM | POA: Diagnosis not present

## 2019-10-14 DIAGNOSIS — L600 Ingrowing nail: Secondary | ICD-10-CM | POA: Diagnosis not present

## 2019-11-21 ENCOUNTER — Other Ambulatory Visit: Payer: Self-pay

## 2019-11-21 ENCOUNTER — Emergency Department (HOSPITAL_COMMUNITY): Payer: Medicare Other

## 2019-11-21 ENCOUNTER — Encounter (HOSPITAL_COMMUNITY): Payer: Self-pay

## 2019-11-21 ENCOUNTER — Emergency Department (HOSPITAL_COMMUNITY)
Admission: EM | Admit: 2019-11-21 | Discharge: 2019-11-22 | Disposition: A | Discharge status: Home / Self Care | Payer: Medicare Other | Attending: Emergency Medicine | Admitting: Emergency Medicine

## 2019-11-21 DIAGNOSIS — M25519 Pain in unspecified shoulder: Secondary | ICD-10-CM | POA: Diagnosis not present

## 2019-11-21 DIAGNOSIS — Y9389 Activity, other specified: Secondary | ICD-10-CM | POA: Diagnosis not present

## 2019-11-21 DIAGNOSIS — Z79899 Other long term (current) drug therapy: Secondary | ICD-10-CM | POA: Insufficient documentation

## 2019-11-21 DIAGNOSIS — S42292A Other displaced fracture of upper end of left humerus, initial encounter for closed fracture: Secondary | ICD-10-CM | POA: Diagnosis not present

## 2019-11-21 DIAGNOSIS — Z743 Need for continuous supervision: Secondary | ICD-10-CM | POA: Diagnosis not present

## 2019-11-21 DIAGNOSIS — S61210A Laceration without foreign body of right index finger without damage to nail, initial encounter: Secondary | ICD-10-CM | POA: Diagnosis not present

## 2019-11-21 DIAGNOSIS — S61220A Laceration with foreign body of right index finger without damage to nail, initial encounter: Secondary | ICD-10-CM | POA: Diagnosis not present

## 2019-11-21 DIAGNOSIS — W01198A Fall on same level from slipping, tripping and stumbling with subsequent striking against other object, initial encounter: Secondary | ICD-10-CM | POA: Diagnosis not present

## 2019-11-21 DIAGNOSIS — S42352A Displaced comminuted fracture of shaft of humerus, left arm, initial encounter for closed fracture: Secondary | ICD-10-CM | POA: Diagnosis not present

## 2019-11-21 DIAGNOSIS — W19XXXA Unspecified fall, initial encounter: Secondary | ICD-10-CM | POA: Diagnosis not present

## 2019-11-21 DIAGNOSIS — S61411A Laceration without foreign body of right hand, initial encounter: Secondary | ICD-10-CM | POA: Diagnosis not present

## 2019-11-21 DIAGNOSIS — Y999 Unspecified external cause status: Secondary | ICD-10-CM | POA: Diagnosis not present

## 2019-11-21 DIAGNOSIS — S4992XA Unspecified injury of left shoulder and upper arm, initial encounter: Secondary | ICD-10-CM | POA: Diagnosis present

## 2019-11-21 DIAGNOSIS — I10 Essential (primary) hypertension: Secondary | ICD-10-CM | POA: Diagnosis not present

## 2019-11-21 DIAGNOSIS — S42202A Unspecified fracture of upper end of left humerus, initial encounter for closed fracture: Secondary | ICD-10-CM | POA: Diagnosis not present

## 2019-11-21 DIAGNOSIS — Y92008 Other place in unspecified non-institutional (private) residence as the place of occurrence of the external cause: Secondary | ICD-10-CM | POA: Diagnosis not present

## 2019-11-21 DIAGNOSIS — S42212A Unspecified displaced fracture of surgical neck of left humerus, initial encounter for closed fracture: Secondary | ICD-10-CM | POA: Diagnosis not present

## 2019-11-21 DIAGNOSIS — S61212A Laceration without foreign body of right middle finger without damage to nail, initial encounter: Secondary | ICD-10-CM | POA: Diagnosis not present

## 2019-11-21 MED ORDER — TRAMADOL HCL 50 MG PO TABS: 50.0000 mg | ORAL_TABLET | Freq: Four times a day (QID) | ORAL | 0 refills | Status: DC | PRN

## 2019-11-21 MED ORDER — LIDOCAINE HCL (PF) 2 % IJ SOLN
INTRAMUSCULAR | Status: AC
  Filled 2019-11-21: qty 20

## 2019-11-21 MED ORDER — POVIDONE-IODINE 10 % EX SOLN
CUTANEOUS | Status: AC
  Filled 2019-11-21: qty 15

## 2019-11-21 NOTE — ED Triage Notes (Signed)
Pt lives alone and was going out to a shed in which she cares for her cats and donkeys. Pt fell landing on left shoulder with possible dislocation and has many skin tears to right hand

## 2019-11-21 NOTE — ED Provider Notes (Addendum)
[facility] EMERGENCY DEPARTMENT Provider Note   CSN: [id number] Arrival date & time: 11/21/19  1848     History Chief Complaint  Patient presents with  . Shoulder Pain    Joyce Hogan is a 86 y.o. female.  Patient had gone out to her shed she was trying to get one of her cats into the shed.  And she had a fall in the landing of that she had landed on her left shoulder.  With lots of pain in that area.  And has a lacerations to her right index finger.  The injury to the index finger does appear dirty.  Patient states tetanus is up-to-date last had one 4 years ago.  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  She did not hit her head she did not get knocked out she does not have any hip or lower extremity pain.  No back pain no neck pain.  Past medical history for her is significant and that there is not anything significant.  Patient states she is very healthy.        History reviewed. No pertinent past medical history.  There are no problems to display for this patient.   Past Surgical History:  Procedure Laterality Date  . ABDOMINAL HYSTERECTOMY    . CATARACT EXTRACTION W/PHACO Right 01/04/2016   Procedure: CATARACT EXTRACTION PHACO AND INTRAOCULAR LENS PLACEMENT (IOC);  Surgeon: Tonny Branch, MD;  Location: [facility];  Service: Ophthalmology;  Laterality: Right;  CDE 9.06  . CATARACT EXTRACTION W/PHACO Left 01/21/2016   Procedure: CATARACT EXTRACTION PHACO AND INTRAOCULAR LENS PLACEMENT (IOC);  Surgeon: Tonny Branch, MD;  Location: [facility];  Service: Ophthalmology;  Laterality: Left;  CDE: 12.65  . TONSILLECTOMY       OB History   No obstetric history on file.     No family history on file.  Social History   Tobacco Use  . Smoking status: Never Smoker  Substance Use Topics  . Alcohol use: No  . Drug use: No    Home Medications Prior to Admission medications   Medication Sig Start Date End Date Taking? Authorizing Provider  Multiple Vitamins-Minerals  (ICAPS AREDS 2 PO) Take 2 tablets by mouth daily.   Yes [provider]  traMADol (ULTRAM) 50 MG tablet Take 1 tablet (50 mg total) by mouth every 6 (six) hours as needed. 11/21/19   Fredia Sorrow, MD    Allergies    Penicillins  Review of Systems   Review of Systems  Constitutional: Negative for chills and fever.  HENT: Negative for congestion, rhinorrhea and sore throat.   Eyes: Negative for visual disturbance.  Respiratory: Negative for cough and shortness of breath.   Cardiovascular: Negative for chest pain and leg swelling.  Gastrointestinal: Negative for abdominal pain, diarrhea, nausea and vomiting.  Genitourinary: Negative for dysuria.  Musculoskeletal: Negative for back pain and neck pain.  Skin: Positive for wound. Negative for rash.  Neurological: Negative for dizziness, light-headedness and headaches.  Hematological: Does not bruise/bleed easily.  Psychiatric/Behavioral: Negative for confusion.    Physical Exam Updated Vital Signs BP 140/68   Pulse 86   Temp 98.4 F (36.9 C) (Oral)   Resp 16   Ht 1.549 m (5\' 1" )   Wt 53.1 kg   SpO2 97%   BMI 22.11 kg/m   Physical Exam Vitals and nursing note reviewed.  Constitutional:      General: She is not in acute distress.    Appearance:  Normal appearance. She is well-developed.  HENT:     Head: Normocephalic and atraumatic.  Eyes:     Extraocular Movements: Extraocular movements intact.     Conjunctiva/sclera: Conjunctivae normal.     Pupils: Pupils are equal, round, and reactive to light.  Neck:     Comments: No posterior tenderness to palpation to the cervical spine. Cardiovascular:     Rate and Rhythm: Normal rate and regular rhythm.     Heart sounds: No murmur.  Pulmonary:     Effort: Pulmonary effort is normal. No respiratory distress.     Breath sounds: Normal breath sounds.  Abdominal:     Palpations: Abdomen is soft.     Tenderness: There is no abdominal tenderness.  Musculoskeletal:         General: Tenderness and deformity present.     Cervical back: Normal range of motion and neck supple.     Comments: Tenderness to the proximal left humerus area.  No tenderness at the elbow or wrist.  Radial pulse 2+.  Sensation intact distally.  Lower extremities without any pain to range of motion or any deformity.  The right upper extremity significant for the right hand right index finger that has 3 avulsion-like lacerations all in the proximal digit.  1 measures about 2 cm and the other 2 are 1 cm.  Radial pulses 2+ cap refill to the fingers is less than 2 seconds.  No obvious deformity.  No obvious foreign body.  Skin:    General: Skin is warm and dry.     Capillary Refill: Capillary refill takes less than 2 seconds.  Neurological:     General: No focal deficit present.     Mental Status: She is alert and oriented to person, place, and time.     Cranial Nerves: No cranial nerve deficit.     Sensory: No sensory deficit.     Motor: No weakness.     ED Results / Procedures / Treatments   Labs (all labs ordered are listed, but only abnormal results are displayed) Labs Reviewed - No data to display  EKG None  Radiology DG Shoulder Left  Result Date: 11/21/2019 CLINICAL DATA:  Left shoulder pain after fall. EXAM: LEFT SHOULDER - 2+ VIEW COMPARISON:  None. FINDINGS: Displaced and comminuted fracture through the proximal humerus with dominant fracture plane through the surgical neck. There is 2 cm of osseous distraction. Displaced involvement of the humeral head. Glenohumeral alignment is not well assessed on the provided views. Acromioclavicular joint is congruent with mild degenerative change. No evidence of acute rib fracture. IMPRESSION: Displaced comminuted proximal humerus fracture with dominant fracture plane through the surgical neck, at least 2 cm osseous distraction. Electronically Signed   By: Keith Rake M.D.   On: 11/21/2019 19:53   DG Hand Complete Right  Result Date:  11/21/2019 CLINICAL DATA:  Fall with laceration. Rule out foreign body. EXAM: RIGHT HAND - COMPLETE 3+ VIEW COMPARISON:  None. FINDINGS: There is no evidence of fracture or dislocation. Multifocal osteoarthritis, mild. Skin irregularity about the proximal aspect of the index finger. Tiny punctate density in the region of skin irregularity volar radially may reflect a tiny foreign body. IMPRESSION: 1. No acute osseous abnormality. 2. Skin irregularity about the proximal index finger with possible tiny foreign body in the region of skin irregularity volar radial aspect. Electronically Signed   By: Keith Rake M.D.   On: 11/21/2019 19:49    Procedures Procedures (including critical care time)  Medications Ordered in ED Medications  lidocaine (XYLOCAINE) 2 % injection (  Given by Other 11/21/19 2311)  povidone-iodine (BETADINE) 10 % external solution (  Given 11/21/19 2311)    ED Course  I have reviewed the triage vital signs and the nursing notes.  Pertinent labs & imaging results that were available during my care of the patient were reviewed by me and considered in my medical decision making (see chart for details).    MDM Rules/Calculators/A&P                      X-ray of shows proximal humerus fracture.  With displacement of the humeral head.  At the surgical neck.  Discussed with Dr. Aline Brochure from orthopedics he says that she can just be placed in a sling and it will heal and he will follow her up in the office.  X-ray of the right hand showed no bony abnormalities raise some concern for may be a small foreign body.  Patient's right hand wounds were cleaned up.  They will require some loose laceration repair.  This will be done by the physician assistant.  She will clean them up well and explore them well for foreign body.  Patient will be treated with tramadol for the pain.  Patient very healthy very capable.  Despite her age. Final Clinical Impression(s) / ED Diagnoses Final  diagnoses:  Other closed displaced fracture of proximal end of left humerus, initial encounter  Laceration of right index finger with foreign body without damage to nail, initial encounter    Rx / DC Orders ED Discharge Orders         Ordered    traMADol (ULTRAM) 50 MG tablet  Every 6 hours PRN     11/21/19 2335           Fredia Sorrow, MD 11/21/19 2341    Addendum: Medical screening examination/treatment/procedure(s) were conducted as a shared visit with non-physician practitioner(s) and myself.  I personally evaluated the patient during the encounter.       Fredia Sorrow, MD 11/21/19 [phone]

## 2019-11-22 MED ORDER — CEPHALEXIN 500 MG PO CAPS: 500.0000 mg | ORAL_CAPSULE | Freq: Four times a day (QID) | ORAL | 0 refills | Status: DC

## 2019-11-22 NOTE — Discharge Instructions (Signed)
Take the tramadol as needed for pain.  Keep the left arm in the shoulder immobilizer as often as possible.  Can take it off to bathe.  Keep the lacerations to the right index finger clean and dry for 24 hours.  Then you can wash them with soap and water and reapply bandages.  Along with some antibiotic ointment.  Would recommend using back to trace and ointment since she has a penicillin allergy.  Make an appointment follow-up with orthopedics for the fracture.  Sutures can be removed from the index finger and about 7 days.  Also take the antibiotic Keflex as directed for the next 7 days.

## 2019-11-22 NOTE — ED Notes (Signed)
Patient unable to sign due to stitches on the right hand and left humerus fracture.

## 2019-11-22 NOTE — ED Provider Notes (Signed)
   I was asked by the attending physician to suture the wounds of the right index and middle fingers.  This was my only involvement in this patient's care.     LACERATION REPAIR # 1 Performed by: Masayo Fera Authorized by: Esra Frankowski Consent: Verbal consent obtained. Risks and benefits: risks, benefits and alternatives were discussed Consent given by: patient Patient identity confirmed: provided demographic data Prepped and Draped in normal sterile fashion Wound explored  Laceration Location: right index finger  Laceration Length: 4 cm  Wound contaminated with dirt  Anesthesia: local infiltration  Local anesthetic: lidocaine 2% w/o epinephrine  Anesthetic total: 3 ml  Irrigation method: syringe Amount of cleaning: Thorough irrigation with saline Skin closure:  4-0 prolene  Number of sutures: 2 sutures to radial aspect of finger, 5 sutures to the ulnar aspect of the finger  Technique: Simple interrupted, loosely approximated  Patient tolerance: Patient tolerated the procedure well with no immediate complications.    LACERATION REPAIR #2  Performed by: Chevonne Bostrom Authorized by: Eduar Kumpf Consent: Verbal consent obtained. Risks and benefits: risks, benefits and alternatives were discussed Consent given by: patient Patient identity confirmed: provided demographic data Prepped and Draped in normal sterile fashion Wound explored  Laceration Location: Right middle finger  Laceration Length: 1.5 cm  Wound contaminated with dirt  Anesthesia: local infiltration  Local anesthetic: lidocaine 2 % w/o epinephrine  Anesthetic total: 2 ml  Irrigation method: syringe Amount of cleaning: Thorough irrigation with saline  Skin closure: 4-0 Prolene  Number of sutures: 2  Technique: Simple interrupted, loosely approximated  Patient tolerance: Patient tolerated the procedure well with no immediate complications.   Wounds thoroughly irrigated and  explored prior to closure. Remains neurovascularly intact. Hemostasis obtained. TD is up-to-date. Wounds dressed with Xeroform and gauze and Rx written for abx. Wound care instructions discussed and she agrees to sutures out in 8-10 days.     Kem Parkinson, PA-C 11/22/19 0015    Fredia Sorrow, MD 11/22/19 [phone]

## 2019-11-25 ENCOUNTER — Ambulatory Visit: Payer: Medicare Other | Admitting: Orthopedic Surgery

## 2019-11-25 ENCOUNTER — Encounter: Payer: Self-pay | Admitting: Orthopedic Surgery

## 2019-11-25 ENCOUNTER — Other Ambulatory Visit: Payer: Self-pay

## 2019-11-25 VITALS — BP 130/71 | HR 79 | Temp 97.7°F | Ht 61.0 in

## 2019-11-25 DIAGNOSIS — S61211A Laceration without foreign body of left index finger without damage to nail, initial encounter: Secondary | ICD-10-CM

## 2019-11-25 DIAGNOSIS — S61215A Laceration without foreign body of left ring finger without damage to nail, initial encounter: Secondary | ICD-10-CM | POA: Diagnosis not present

## 2019-11-25 DIAGNOSIS — S42292A Other displaced fracture of upper end of left humerus, initial encounter for closed fracture: Secondary | ICD-10-CM | POA: Diagnosis not present

## 2019-11-25 NOTE — Patient Instructions (Addendum)
Humerus Fracture Treated With Immobilization  The humerus is the large bone in the upper arm. A broken (fractured) humerus is often treated by wearing a cast, splint, or sling (immobilization). This holds the broken pieces in place so they can heal. What are the causes? This condition may be caused by:  A fall.  A hard, direct hit to the arm.  A car accident. What increases the risk? You are more likely to develop this condition if:  You are elderly.  You have a disease that makes the bones thin and weak. What are the signs or symptoms?  Pain.  Swelling.  Bruising.  Not being able to move your arm normally. How is this treated? Treatment involves wearing a cast, splint, or sling until your arm heals enough for you to begin range-of-motion exercises. You may also be prescribed pain medicine. Follow these instructions at home: If you have a cast:  Do not stick anything inside the cast to scratch your skin.  Check the skin around the cast every day. Tell your doctor if you have any concerns.  You may put lotion on dry skin around the edges of the cast. Do not put lotion on the skin under the cast.  Keep the cast clean and dry. If you have a splint or sling:  Wear the splint or sling as told by your doctor. Remove it only as told by your doctor.  Loosen the splint or sling if your fingers: ? Tingle. ? Become numb. ? Turn cold and blue.  Keep the splint or sling clean and dry. Bathing  Do not take baths, swim, or use a hot tub until your doctor says that you can. Ask your doctor if you may take showers. You may only be allowed to take sponge baths.  If your cast, splint, or sling is not waterproof: ? Do not let it get wet. ? Cover it with a watertight covering when you take a bath or shower.  If you have a sling, remove it for bathing only if your doctor says this is okay. Managing pain, stiffness, and swelling   If told, put ice on the injured area. ? If you  have a removable splint or sling, remove it as told by your doctor. ? Put ice in a plastic bag. ? Place a towel between your skin and the bag or between your cast and the bag. ? Leave the ice on for 20 minutes, 2-3 times a day.  Move your fingers often.  Raise (elevate) the injured area above the level of your heart while you are sitting or lying down. Driving  Do not drive or use heavy machinery while taking prescription pain medicine.  Do not drive while wearing a cast, splint, or sling on an arm that you use for driving. Activity  Return to your normal activities as told by your doctor. Ask your doctor what activities are safe for you.  Do not lift anything until your doctor says that it is safe.  Do range-of-motion exercises only as told by your doctor. General instructions  Do not put pressure on any part of the cast or splint until it is fully hardened. This may take many hours.  Do not use any products that contain nicotine or tobacco, such as cigarettes, e-cigarettes, and chewing tobacco. These can delay bone healing. If you need help quitting, ask your doctor.  Take over-the-counter and prescription medicines only as told by your doctor.  Ask your doctor if the medicine  you are taking can cause trouble pooping (constipation). You may need to take steps to prevent or treat trouble pooping: ? Drink enough fluid to keep your pee (urine) pale yellow. ? Take over-the-counter or prescription medicines. ? Eat foods that are high in fiber. These include beans, whole grains, and fresh fruits and vegetables. ? Limit foods that are high in fat and sugar. These include fried or sweet foods.  Keep all follow-up visits as told by your doctor. This is important. Contact a doctor if:  You have any new pain, swelling, or bruising.  Your pain, swelling, and bruising do not get better.  Your cast, splint, or sling becomes loose or damaged. Get help right away if:  Your skin or  fingers on your injured arm turn blue or gray.  Your arm is cold or numb.  You have very bad pain in your injured arm. Summary  The humerus is the large bone in the upper arm.  A broken humerus is often treated by wearing a cast, splint, or sling.  Wear a splint or sling as told by your doctor. Remove it only as told by your doctor.  Move your fingers often. This information is not intended to replace advice given to you by your health care provider. Make sure you discuss any questions you have with your health care provider. Document Revised: 05/14/2018 Document Reviewed: 05/14/2018 Elsevier Patient Education  [place].   Tylenol 500 mg q6 hrs over the counter

## 2019-11-25 NOTE — Progress Notes (Signed)
Joyce Hogan  11/25/2019  Body mass index is 22.11 kg/m.   HISTORY SECTION :  Chief Complaint  Patient presents with  . Shoulder Injury    ER follow up on left shoulder freacture, DOI 11-21-19.   HPI The patient presents for evaluation of  (mild/moderate/severe/ ) mild pain, in the (right /left) right shoulder, for 2 days, associated with swelling and laceration right index finger and right long finger.  Prior treatment sling sutures of the lacerations   Review of Systems  Musculoskeletal: Positive for falls and joint pain.  Neurological: Negative for tingling.     has no past medical history on file.   Past Surgical History:  Procedure Laterality Date  . ABDOMINAL HYSTERECTOMY    . CATARACT EXTRACTION W/PHACO Right 01/04/2016   Procedure: CATARACT EXTRACTION PHACO AND INTRAOCULAR LENS PLACEMENT (IOC);  Surgeon: Tonny Branch, MD;  Location: [facility];  Service: Ophthalmology;  Laterality: Right;  CDE 9.06  . CATARACT EXTRACTION W/PHACO Left 01/21/2016   Procedure: CATARACT EXTRACTION PHACO AND INTRAOCULAR LENS PLACEMENT (IOC);  Surgeon: Tonny Branch, MD;  Location: [facility];  Service: Ophthalmology;  Laterality: Left;  CDE: 12.65  . TONSILLECTOMY      Body mass index is 22.11 kg/m.   Allergies  Allergen Reactions  . Penicillins     Did it involve swelling of the face/tongue/throat, SOB, or low BP? Unknown Did it involve sudden or severe rash/hives, skin peeling, or any reaction on the inside of your mouth or nose? Unknown Did you need to seek medical attention at a hospital or doctor's office? Unknown When did it last happen? 50 years If all above answers are "NO", may proceed with cephalosporin use.      Current Outpatient Medications:  .  cephALEXin (KEFLEX) 500 MG capsule, Take 1 capsule (500 mg total) by mouth 4 (four) times daily., Disp: 28 capsule, Rfl: 0 .  Multiple Vitamins-Minerals (ICAPS AREDS 2 PO), Take 2 tablets by mouth daily., Disp: , Rfl:  .  traMADol  (ULTRAM) 50 MG tablet, Take 1 tablet (50 mg total) by mouth every 6 (six) hours as needed., Disp: 15 tablet, Rfl: 0   PHYSICAL EXAM SECTION: 1) BP 130/71   Pulse 79   Temp 97.7 F (36.5 C)   Ht 5\' 1"  (1.549 m)   BMI 22.11 kg/m   Body mass index is 22.11 kg/m. General appearance: Well-developed well-nourished no gross deformities  2) Cardiovascular normal pulse and perfusion , normal color   3) Neurologically deep tendon reflexes are equal and normal, no sensation loss or deficits no pathologic reflexes  4) Psychological: Awake alert and oriented x3 mood and affect normal  5) Skin no lacerations or ulcerations no nodularity no palpable masses, no erythema or nodularity  6) Musculoskeletal:   Right shoulder nontender no range of motion abnormalities normal muscle tone  Left shoulder tenderness proximal humerus normal sensation lateral shoulder normal hand function motor function neurovascular exam intact  Right index finger and long finger lacerations with sutures in place   MEDICAL DECISION MAKING  A.  Encounter Diagnoses  Name Primary?  . Other closed displaced fracture of proximal end of left humerus, initial encounter Yes  . Laceration of left index finger without damage to nail, foreign body presence unspecified, initial encounter   . Laceration of left ring finger without damage to nail, foreign body presence unspecified, initial encounter     B. DATA ANALYSED:  IMAGING: Independent interpretation of images: Outside x-rays show a displaced proximal humerus  fracture  The treatment in this 86 year old is going to be nonoperative treatment based on age medical condition overall results of surgery in this age group  Lacerations can be treated with dressing changes and Neosporin continue Keflex until 7 days  Follow-up 2 weeks for x-rays No orders of the defined types were placed in this encounter.     Arther Abbott, MD  11/25/2019 11:34 AM

## 2019-12-03 DIAGNOSIS — H53413 Scotoma involving central area, bilateral: Secondary | ICD-10-CM | POA: Diagnosis not present

## 2019-12-03 DIAGNOSIS — H353190 Nonexudative age-related macular degeneration, unspecified eye, stage unspecified: Secondary | ICD-10-CM | POA: Diagnosis not present

## 2019-12-03 DIAGNOSIS — H542X12 Low vision right eye category 1, low vision left eye category 2: Secondary | ICD-10-CM | POA: Diagnosis not present

## 2019-12-04 DIAGNOSIS — S42202A Unspecified fracture of upper end of left humerus, initial encounter for closed fracture: Secondary | ICD-10-CM | POA: Insufficient documentation

## 2019-12-08 ENCOUNTER — Encounter (HOSPITAL_COMMUNITY): Payer: Self-pay | Admitting: Emergency Medicine

## 2019-12-08 ENCOUNTER — Emergency Department (HOSPITAL_COMMUNITY): Payer: Medicare Other

## 2019-12-08 ENCOUNTER — Other Ambulatory Visit: Payer: Self-pay

## 2019-12-08 ENCOUNTER — Emergency Department (HOSPITAL_COMMUNITY)
Admission: EM | Admit: 2019-12-08 | Discharge: 2019-12-08 | Disposition: A | Discharge status: Home / Self Care | Payer: Medicare Other | Attending: Emergency Medicine | Admitting: Emergency Medicine

## 2019-12-08 DIAGNOSIS — R918 Other nonspecific abnormal finding of lung field: Secondary | ICD-10-CM | POA: Diagnosis not present

## 2019-12-08 DIAGNOSIS — R600 Localized edema: Secondary | ICD-10-CM | POA: Insufficient documentation

## 2019-12-08 DIAGNOSIS — I4510 Unspecified right bundle-branch block: Secondary | ICD-10-CM | POA: Diagnosis not present

## 2019-12-08 DIAGNOSIS — R2243 Localized swelling, mass and lump, lower limb, bilateral: Secondary | ICD-10-CM | POA: Diagnosis present

## 2019-12-08 LAB — COMPREHENSIVE METABOLIC PANEL
ALT: 11 U/L (ref 0–44)
AST: 15 U/L (ref 15–41)
Albumin: 3.4 g/dL — ABNORMAL LOW (ref 3.5–5.0)
Alkaline Phosphatase: 73 U/L (ref 38–126)
Anion gap: 6 (ref 5–15)
BUN: 19 mg/dL (ref 8–23)
CO2: 26 mmol/L (ref 22–32)
Calcium: 8.7 mg/dL — ABNORMAL LOW (ref 8.9–10.3)
Chloride: 105 mmol/L (ref 98–111)
Creatinine, Ser: 0.78 mg/dL (ref 0.44–1.00)
GFR calc Af Amer: >60 mL/min (ref >60)
GFR calc non Af Amer: >60 mL/min (ref >60)
Glucose, Bld: 126 mg/dL — ABNORMAL HIGH (ref 70–99)
Potassium: 4.2 mmol/L (ref 3.5–5.1)
Sodium: 137 mmol/L (ref 135–145)
Total Bilirubin: 0.6 mg/dL (ref 0.3–1.2)
Total Protein: 6.2 g/dL — ABNORMAL LOW (ref 6.5–8.1)

## 2019-12-08 LAB — CBC
HCT: 33.5 % — ABNORMAL LOW (ref 36.0–46.0)
Hemoglobin: 10.4 g/dL — ABNORMAL LOW (ref 12.0–15.0)
MCH: 31.1 pg (ref 26.0–34.0)
MCHC: 31 g/dL (ref 30.0–36.0)
MCV: 100.3 fL — ABNORMAL HIGH (ref 80.0–100.0)
Platelets: 258 10*3/uL (ref 150–400)
RBC: 3.34 MIL/uL — ABNORMAL LOW (ref 3.87–5.11)
RDW: 13.7 % (ref 11.5–15.5)
WBC: 6.2 10*3/uL (ref 4.0–10.5)
nRBC: 0 % (ref 0.0–0.2)

## 2019-12-08 LAB — BRAIN NATRIURETIC PEPTIDE: B Natriuretic Peptide: 103 pg/mL — ABNORMAL HIGH (ref 0.0–100.0)

## 2019-12-08 NOTE — ED Triage Notes (Signed)
Pt complains of bilateral leg swelling.

## 2019-12-08 NOTE — ED Provider Notes (Signed)
[facility] Emergency Department Provider Note MRN:  [id number]  Arrival date & time: 12/08/19     Chief Complaint   Leg Swelling   History of Present Illness   Joyce Hogan is a 86 y.o. year-old female with no pertinent past medical presenting to the ED with chief complaint of leg swelling.  Caregiver was helping patient dressed today and pressed on the left leg, which caused pain to the patient, sent here for evaluation.  Both legs swollen, left worse than right.  Patient denies fever, no headache or vision change, no chest pain or shortness of breath, no dyspnea on exertion, no abdominal pain.  Continued left shoulder pain due to a fall and fractured recently.  Pain in the legs is mild to moderate, worse with motion or palpation.  Review of Systems  A complete 10 system review of systems was obtained and all systems are negative except as noted in the HPI and PMH.   Patient's Health History   History reviewed. No pertinent past medical history.  Past Surgical History:  Procedure Laterality Date  . ABDOMINAL HYSTERECTOMY    . CATARACT EXTRACTION W/PHACO Right 01/04/2016   Procedure: CATARACT EXTRACTION PHACO AND INTRAOCULAR LENS PLACEMENT (IOC);  Surgeon: Tonny Branch, MD;  Location: [facility];  Service: Ophthalmology;  Laterality: Right;  CDE 9.06  . CATARACT EXTRACTION W/PHACO Left 01/21/2016   Procedure: CATARACT EXTRACTION PHACO AND INTRAOCULAR LENS PLACEMENT (IOC);  Surgeon: Tonny Branch, MD;  Location: [facility];  Service: Ophthalmology;  Laterality: Left;  CDE: 12.65  . TONSILLECTOMY      Family History  Problem Relation Age of Onset  . Dementia Mother     Social History   Socioeconomic History  . Marital status: Widowed    Spouse name: Not on file  . Number of children: Not on file  . Years of education: Not on file  . Highest education level: Not on file  Occupational History  . Not on file  Tobacco Use  . Smoking status: Never Smoker  . Smokeless  tobacco: Never Used  Substance and Sexual Activity  . Alcohol use: No  . Drug use: No  . Sexual activity: Never    Birth control/protection: Surgical  Other Topics Concern  . Not on file  Social History Narrative  . Not on file   Social Determinants of Health   Financial Resource Strain:   . Difficulty of Paying Living Expenses:   Food Insecurity:   . Worried About Charity fundraiser in the Last Year:   . Arboriculturist in the Last Year:   Transportation Needs:   . Film/video editor (Medical):   Marland Kitchen Lack of Transportation (Non-Medical):   Physical Activity:   . Days of Exercise per Week:   . Minutes of Exercise per Session:   Stress:   . Feeling of Stress :   Social Connections:   . Frequency of Communication with Friends and Family:   . Frequency of Social Gatherings with Friends and Family:   . Attends Religious Services:   . Active Member of Clubs or Organizations:   . Attends Archivist Meetings:   Marland Kitchen Marital Status:   Intimate Partner Violence:   . Fear of Current or Ex-Partner:   . Emotionally Abused:   Marland Kitchen Physically Abused:   . Sexually Abused:      Physical Exam   Vitals:   12/08/19 1818 12/08/19 1830  BP: (!) 146/59 (!) 142/61  Pulse:  84 79  Resp: (!) 22 20  Temp:    SpO2: 97% 97%    CONSTITUTIONAL: Well-appearing, NAD NEURO:  Alert and oriented x 3, no focal deficits EYES:  eyes equal and reactive ENT/NECK:  no LAD, no JVD CARDIO: Regular rate, well-perfused, normal S1 and S2 PULM:  CTAB no wheezing or rhonchi GI/GU:  normal bowel sounds, non-distended, non-tender MSK/SPINE:  No gross deformities, 2+ pitting edema to bilateral lower extremities, left greater than right, left leg is tender to palpation SKIN:  no rash, atraumatic PSYCH:  Appropriate speech and behavior  *Additional and/or pertinent findings included in MDM below  Diagnostic and Interventional Summary    EKG Interpretation  Date/Time:  Sunday December 08 2019  16:36:53 EDT Ventricular Rate:  94 PR Interval:    QRS Duration: 118 QT Interval:  396 QTC Calculation: 496 R Axis:   -63 Text Interpretation: Sinus rhythm Incomplete RBBB and LAFB Probable left ventricular hypertrophy Baseline wander in lead(s) I II aVR aVF No significant change was found Confirmed by Marry Kusch ([id number]) on 12/08/2019 4:40:45 PM      Labs Reviewed  CBC - Abnormal; Notable for the following components:      Result Value   RBC 3.34 (*)    Hemoglobin 10.4 (*)    HCT 33.5 (*)    MCV 100.3 (*)    All other components within normal limits  BRAIN NATRIURETIC PEPTIDE - Abnormal; Notable for the following components:   B Natriuretic Peptide 103.0 (*)    All other components within normal limits  COMPREHENSIVE METABOLIC PANEL - Abnormal; Notable for the following components:   Glucose, Bld 126 (*)    Calcium 8.7 (*)    Total Protein 6.2 (*)    Albumin 3.4 (*)    All other components within normal limits    DG Chest Port 1 View  Final Result    US Venous Img Lower Bilateral    (Results Pending)    Medications - No data to display   Procedures  /  Critical Care Procedures  ED Course and Medical Decision Making  I have reviewed the triage vital signs, the nursing notes, and pertinent available records from the EMR.  Pertinent labs & imaging results that were available during my care of the patient were reviewed by me and considered in my medical decision making (see below for details).     Question of DVT versus venous stasis versus heart failure, less likely liver or kidney dysfunction, work-up pending.  7 PM update: Work-up largely unrevealing, no evidence of liver or renal or cardiac dysfunction.  Question of DVT remains, unfortunately unable to perform formal DVT ultrasound at this time of day on Sunday.  Patient will return tomorrow for DVT ultrasound.  Discussed the pros and cons of empiric anticoagulation.  Given the patient is 86 years old and has had  recent falls causing left shoulder fracture, we collectively decided to hold off on anticoagulation until we have the results of the DVT ultrasound tomorrow.  Patient fully aware of risks and benefits.  Attempted to call family but no answer.  Cleora Karnik M. Shoshanah Dapper, MD [facility] [email or web address]  Final Clinical Impressions(s) / ED Diagnoses     ICD-10-CM   1. Leg edema  R60.0     ED Discharge Orders         Ordered    US Venous Img Lower Bilateral     03 /14/21 1856  Discharge Instructions Discussed with and Provided to Patient:     Discharge Instructions     You were evaluated in the Emergency Department and after careful evaluation, we did not find any emergent condition requiring admission or further testing in the hospital.  Your exam/testing today was overall reassuring.  There is still a possibility of a blood clot in the leg.  As discussed, we were unable to perform the ultrasound over the weekend.  We have ordered an ultrasound for you tomorrow morning here at [facility]  Please return to the Emergency Department if you experience any worsening of your condition.  We encourage you to follow up with a primary care provider.  Thank you for allowing [place] to be a part of your care.        Maudie Flakes, MD 12/08/19 [phone]

## 2019-12-08 NOTE — Progress Notes (Signed)
Bilateral foot swelling with palpable pulse to the R foot (marked)

## 2019-12-08 NOTE — Discharge Instructions (Addendum)
You were evaluated in the Emergency Department and after careful evaluation, we did not find any emergent condition requiring admission or further testing in the hospital.  Your exam/testing today was overall reassuring.  There is still a possibility of a blood clot in the leg.  As discussed, we were unable to perform the ultrasound over the weekend.  We have ordered an ultrasound for you tomorrow morning here at [facility].  Please return to the Emergency Department if you experience any worsening of your condition.  We encourage you to follow up with a primary care provider.  Thank you for allowing [place] to be a part of your care.

## 2019-12-08 NOTE — ED Notes (Signed)
Call to [facility], caregiver  She is on the way to pick up patient and will call when here

## 2019-12-08 NOTE — ED Notes (Signed)
Fell 2 weeks ago  Dislocated L shoulder   Today when caregiver pulled up her pants, noticed her L calf was darker and pressed it causeing discomfort  Here for eval

## 2019-12-08 NOTE — ED Notes (Signed)
Out to car to caregiver, La[place]  DC instructions to both Fisher and pt regarding return and call to [place] in the am due to conflicting appointments

## 2019-12-08 NOTE — ED Notes (Signed)
  Call from caregiver Helmut Muster [phone]  She is given update and assured that she will be called once a disposition is given

## 2019-12-09 ENCOUNTER — Ambulatory Visit: Payer: Medicare Other

## 2019-12-09 ENCOUNTER — Ambulatory Visit (INDEPENDENT_AMBULATORY_CARE_PROVIDER_SITE_OTHER): Payer: Medicare Other | Admitting: Orthopedic Surgery

## 2019-12-09 ENCOUNTER — Encounter: Payer: Self-pay | Admitting: Orthopedic Surgery

## 2019-12-09 ENCOUNTER — Ambulatory Visit (HOSPITAL_COMMUNITY)
Admission: RE | Admit: 2019-12-09 | Discharge: 2019-12-09 | Disposition: A | Discharge status: Home / Self Care | Payer: Medicare Other | Source: Ambulatory Visit | Attending: Emergency Medicine | Admitting: Emergency Medicine

## 2019-12-09 DIAGNOSIS — R600 Localized edema: Secondary | ICD-10-CM | POA: Diagnosis not present

## 2019-12-09 DIAGNOSIS — S42292A Other displaced fracture of upper end of left humerus, initial encounter for closed fracture: Secondary | ICD-10-CM

## 2019-12-09 DIAGNOSIS — S81802A Unspecified open wound, left lower leg, initial encounter: Secondary | ICD-10-CM | POA: Diagnosis not present

## 2019-12-09 DIAGNOSIS — S42292D Other displaced fracture of upper end of left humerus, subsequent encounter for fracture with routine healing: Secondary | ICD-10-CM | POA: Diagnosis not present

## 2019-12-09 NOTE — Addendum Note (Signed)
Addended byCandice Camp on: 12/09/2019 03:41 PM   Modules accepted: Orders

## 2019-12-09 NOTE — ED Provider Notes (Signed)
Patient returned for outpatient bilateral venous duplex today after ED visit last night, performed & negative for DVT. Discussed results & need for PCP follow up for re-assessment and possible further evaluation/management with patient & her care giver. I discussed results, need for follow-up, and return precautions with the patient & her care giver. Provided opportunity for questions, patient & care giver confirmed understanding and are in agreement with plan.     Joyce Hogan 12/09/19 1625    Davonna Belling, MD 12/10/19 [phone]

## 2019-12-09 NOTE — Progress Notes (Signed)
Chief Complaint  Patient presents with  . Shoulder Pain    left/ improving 11/21/19 date of injury     Left proximal humerus fracture.  Patient is comfortable.  Fracture appears to be going some avascular necrosis  Recommend occupational physical therapy  Follow-up in 6 weeks

## 2019-12-13 DIAGNOSIS — H3530 Unspecified macular degeneration: Secondary | ICD-10-CM | POA: Diagnosis not present

## 2019-12-13 DIAGNOSIS — S42292D Other displaced fracture of upper end of left humerus, subsequent encounter for fracture with routine healing: Secondary | ICD-10-CM | POA: Diagnosis not present

## 2019-12-13 DIAGNOSIS — Z9181 History of falling: Secondary | ICD-10-CM | POA: Diagnosis not present

## 2019-12-16 ENCOUNTER — Telehealth: Payer: Self-pay | Admitting: Orthopedic Surgery

## 2019-12-16 NOTE — Telephone Encounter (Signed)
Verdis Frederickson from [facility] called and wants verbal orders for PT on this patient's shoulder.  Please call her at [phone]  Thanks

## 2019-12-16 NOTE — Telephone Encounter (Signed)
I called with verbal orders  °

## 2019-12-17 ENCOUNTER — Telehealth: Payer: Self-pay | Admitting: Orthopedic Surgery

## 2019-12-17 NOTE — Telephone Encounter (Signed)
I called with OT orders No restrictions/ progress as tolerated.

## 2019-12-17 NOTE — Telephone Encounter (Signed)
Call received via voice message this morning (8:15am) requesting verbal orders for occupational therapy to evaluate and treat left upper extremity; ph# [phone]

## 2019-12-18 DIAGNOSIS — M818 Other osteoporosis without current pathological fracture: Secondary | ICD-10-CM | POA: Diagnosis not present

## 2019-12-18 DIAGNOSIS — Z0000 Encounter for general adult medical examination without abnormal findings: Secondary | ICD-10-CM | POA: Diagnosis not present

## 2019-12-18 DIAGNOSIS — S42122S Displaced fracture of acromial process, left shoulder, sequela: Secondary | ICD-10-CM | POA: Diagnosis not present

## 2019-12-18 DIAGNOSIS — I10 Essential (primary) hypertension: Secondary | ICD-10-CM | POA: Diagnosis not present

## 2019-12-19 DIAGNOSIS — Z9181 History of falling: Secondary | ICD-10-CM | POA: Diagnosis not present

## 2019-12-19 DIAGNOSIS — H3530 Unspecified macular degeneration: Secondary | ICD-10-CM | POA: Diagnosis not present

## 2019-12-19 DIAGNOSIS — S42292D Other displaced fracture of upper end of left humerus, subsequent encounter for fracture with routine healing: Secondary | ICD-10-CM | POA: Diagnosis not present

## 2019-12-20 DIAGNOSIS — S42292D Other displaced fracture of upper end of left humerus, subsequent encounter for fracture with routine healing: Secondary | ICD-10-CM | POA: Diagnosis not present

## 2019-12-20 DIAGNOSIS — Z9181 History of falling: Secondary | ICD-10-CM | POA: Diagnosis not present

## 2019-12-20 DIAGNOSIS — H3530 Unspecified macular degeneration: Secondary | ICD-10-CM | POA: Diagnosis not present

## 2019-12-23 DIAGNOSIS — R600 Localized edema: Secondary | ICD-10-CM | POA: Diagnosis not present

## 2019-12-24 ENCOUNTER — Telehealth: Payer: Self-pay | Admitting: Orthopedic Surgery

## 2019-12-24 DIAGNOSIS — Z9181 History of falling: Secondary | ICD-10-CM | POA: Diagnosis not present

## 2019-12-24 DIAGNOSIS — S42292D Other displaced fracture of upper end of left humerus, subsequent encounter for fracture with routine healing: Secondary | ICD-10-CM | POA: Diagnosis not present

## 2019-12-24 DIAGNOSIS — H3530 Unspecified macular degeneration: Secondary | ICD-10-CM | POA: Diagnosis not present

## 2019-12-24 NOTE — Telephone Encounter (Signed)
Ms. Cagley's caregiver, Denver Faster called and stated that pt's home health PT is only working on her balance.  [name] states that Ms. Bonadonna's hand and arm are swollen.  She took Ms. Tozzi to see her PCP yesterday who took Ms. Rohlfs out of the sling.  They want to know if there is an order for Ms. Sykora to have PT work on the shoulder, arm, hand range of motion.  If not, does she need one at this time?  Please call and advise [name] of advice  Thanks

## 2019-12-24 NOTE — Telephone Encounter (Signed)
I called Joyce Hogan with home therapy, she will find out what is going on, OT has been ordered and verbal orders called also.   I called [name] to advise this has been ordered and they are checking on sending OT back out. She should hear something today.

## 2019-12-25 ENCOUNTER — Telehealth: Payer: Self-pay | Admitting: Orthopedic Surgery

## 2019-12-25 ENCOUNTER — Telehealth: Payer: Self-pay | Admitting: Radiology

## 2019-12-25 DIAGNOSIS — Z9181 History of falling: Secondary | ICD-10-CM | POA: Diagnosis not present

## 2019-12-25 DIAGNOSIS — H3530 Unspecified macular degeneration: Secondary | ICD-10-CM | POA: Diagnosis not present

## 2019-12-25 DIAGNOSIS — S42292D Other displaced fracture of upper end of left humerus, subsequent encounter for fracture with routine healing: Secondary | ICD-10-CM | POA: Diagnosis not present

## 2019-12-25 NOTE — Telephone Encounter (Signed)
Called to advise no restrictions/ again/ advance as tolerated.

## 2019-12-25 NOTE — Telephone Encounter (Signed)
Jim from [facility] called and wants to know if there are any restrictions regarding the range of motion of Ms. Eggleton's shoulder/arm?  Please call Clair Gulling at [phone] to advise  Thanks

## 2019-12-25 NOTE — Telephone Encounter (Signed)
I called caretaker Denver Faster, since [facility] called me, about patients legs being very swollen, there is also an ER note regarding her legs swelling. [name] states she has seen PCP for this about 2 weeks ago and he is aware. I just advised to continue to follow up, let him know they are still swollen. [name] has voiced understanding   To you FYI

## 2019-12-26 DIAGNOSIS — Z9181 History of falling: Secondary | ICD-10-CM | POA: Diagnosis not present

## 2019-12-26 DIAGNOSIS — S42292D Other displaced fracture of upper end of left humerus, subsequent encounter for fracture with routine healing: Secondary | ICD-10-CM | POA: Diagnosis not present

## 2019-12-26 DIAGNOSIS — H3530 Unspecified macular degeneration: Secondary | ICD-10-CM | POA: Diagnosis not present

## 2019-12-31 DIAGNOSIS — H3530 Unspecified macular degeneration: Secondary | ICD-10-CM | POA: Diagnosis not present

## 2019-12-31 DIAGNOSIS — Z9181 History of falling: Secondary | ICD-10-CM | POA: Diagnosis not present

## 2019-12-31 DIAGNOSIS — S42292D Other displaced fracture of upper end of left humerus, subsequent encounter for fracture with routine healing: Secondary | ICD-10-CM | POA: Diagnosis not present

## 2020-01-01 DIAGNOSIS — S42292D Other displaced fracture of upper end of left humerus, subsequent encounter for fracture with routine healing: Secondary | ICD-10-CM | POA: Diagnosis not present

## 2020-01-01 DIAGNOSIS — H3530 Unspecified macular degeneration: Secondary | ICD-10-CM | POA: Diagnosis not present

## 2020-01-01 DIAGNOSIS — Z9181 History of falling: Secondary | ICD-10-CM | POA: Diagnosis not present

## 2020-01-02 DIAGNOSIS — S42292D Other displaced fracture of upper end of left humerus, subsequent encounter for fracture with routine healing: Secondary | ICD-10-CM | POA: Diagnosis not present

## 2020-01-02 DIAGNOSIS — Z9181 History of falling: Secondary | ICD-10-CM | POA: Diagnosis not present

## 2020-01-02 DIAGNOSIS — M810 Age-related osteoporosis without current pathological fracture: Secondary | ICD-10-CM | POA: Diagnosis not present

## 2020-01-02 DIAGNOSIS — H3530 Unspecified macular degeneration: Secondary | ICD-10-CM | POA: Diagnosis not present

## 2020-01-03 ENCOUNTER — Telehealth: Payer: Self-pay | Admitting: Orthopedic Surgery

## 2020-01-03 DIAGNOSIS — S42202A Unspecified fracture of upper end of left humerus, initial encounter for closed fracture: Secondary | ICD-10-CM | POA: Diagnosis not present

## 2020-01-03 DIAGNOSIS — R6510 Systemic inflammatory response syndrome (SIRS) of non-infectious origin without acute organ dysfunction: Secondary | ICD-10-CM | POA: Diagnosis not present

## 2020-01-03 DIAGNOSIS — Z20822 Contact with and (suspected) exposure to covid-19: Secondary | ICD-10-CM | POA: Diagnosis not present

## 2020-01-03 DIAGNOSIS — L03114 Cellulitis of left upper limb: Secondary | ICD-10-CM | POA: Diagnosis not present

## 2020-01-03 DIAGNOSIS — R2689 Other abnormalities of gait and mobility: Secondary | ICD-10-CM | POA: Diagnosis not present

## 2020-01-03 DIAGNOSIS — W19XXXA Unspecified fall, initial encounter: Secondary | ICD-10-CM | POA: Diagnosis not present

## 2020-01-03 DIAGNOSIS — E871 Hypo-osmolality and hyponatremia: Secondary | ICD-10-CM | POA: Diagnosis not present

## 2020-01-03 DIAGNOSIS — M79632 Pain in left forearm: Secondary | ICD-10-CM | POA: Diagnosis not present

## 2020-01-03 DIAGNOSIS — M6281 Muscle weakness (generalized): Secondary | ICD-10-CM | POA: Diagnosis not present

## 2020-01-03 DIAGNOSIS — I959 Hypotension, unspecified: Secondary | ICD-10-CM | POA: Diagnosis not present

## 2020-01-03 DIAGNOSIS — Z743 Need for continuous supervision: Secondary | ICD-10-CM | POA: Diagnosis not present

## 2020-01-03 DIAGNOSIS — S42212D Unspecified displaced fracture of surgical neck of left humerus, subsequent encounter for fracture with routine healing: Secondary | ICD-10-CM | POA: Diagnosis not present

## 2020-01-03 DIAGNOSIS — R531 Weakness: Secondary | ICD-10-CM | POA: Diagnosis not present

## 2020-01-03 DIAGNOSIS — M7989 Other specified soft tissue disorders: Secondary | ICD-10-CM | POA: Diagnosis not present

## 2020-01-03 DIAGNOSIS — S42292A Other displaced fracture of upper end of left humerus, initial encounter for closed fracture: Secondary | ICD-10-CM | POA: Diagnosis not present

## 2020-01-03 NOTE — Telephone Encounter (Signed)
[place], duly noted

## 2020-01-03 NOTE — Telephone Encounter (Signed)
Per patient's caregiver [name](designated contact on file) patient was rushed to [facility], due to 'problem with arm' - mentioned "may be an infection"; states will keep [place] informed. Caregiver's phone# [phone]

## 2020-01-10 DIAGNOSIS — I959 Hypotension, unspecified: Secondary | ICD-10-CM | POA: Diagnosis not present

## 2020-01-10 DIAGNOSIS — S42212D Unspecified displaced fracture of surgical neck of left humerus, subsequent encounter for fracture with routine healing: Secondary | ICD-10-CM | POA: Diagnosis not present

## 2020-01-10 DIAGNOSIS — R6510 Systemic inflammatory response syndrome (SIRS) of non-infectious origin without acute organ dysfunction: Secondary | ICD-10-CM | POA: Diagnosis not present

## 2020-01-10 DIAGNOSIS — S42202A Unspecified fracture of upper end of left humerus, initial encounter for closed fracture: Secondary | ICD-10-CM | POA: Diagnosis not present

## 2020-01-10 DIAGNOSIS — M6281 Muscle weakness (generalized): Secondary | ICD-10-CM | POA: Diagnosis not present

## 2020-01-10 DIAGNOSIS — E871 Hypo-osmolality and hyponatremia: Secondary | ICD-10-CM | POA: Diagnosis not present

## 2020-01-10 DIAGNOSIS — S42212B Unspecified displaced fracture of surgical neck of left humerus, initial encounter for open fracture: Secondary | ICD-10-CM | POA: Diagnosis not present

## 2020-01-10 DIAGNOSIS — L03114 Cellulitis of left upper limb: Secondary | ICD-10-CM | POA: Diagnosis not present

## 2020-01-10 DIAGNOSIS — W19XXXA Unspecified fall, initial encounter: Secondary | ICD-10-CM | POA: Diagnosis not present

## 2020-01-10 DIAGNOSIS — R2689 Other abnormalities of gait and mobility: Secondary | ICD-10-CM | POA: Diagnosis not present

## 2020-01-10 DIAGNOSIS — L0390 Cellulitis, unspecified: Secondary | ICD-10-CM | POA: Diagnosis not present

## 2020-01-10 DIAGNOSIS — Z743 Need for continuous supervision: Secondary | ICD-10-CM | POA: Diagnosis not present

## 2020-01-10 DIAGNOSIS — R531 Weakness: Secondary | ICD-10-CM | POA: Diagnosis not present

## 2020-01-11 DIAGNOSIS — S42212B Unspecified displaced fracture of surgical neck of left humerus, initial encounter for open fracture: Secondary | ICD-10-CM | POA: Diagnosis not present

## 2020-01-11 DIAGNOSIS — L0390 Cellulitis, unspecified: Secondary | ICD-10-CM | POA: Diagnosis not present

## 2020-01-11 DIAGNOSIS — E871 Hypo-osmolality and hyponatremia: Secondary | ICD-10-CM | POA: Diagnosis not present

## 2020-01-20 ENCOUNTER — Ambulatory Visit (INDEPENDENT_AMBULATORY_CARE_PROVIDER_SITE_OTHER): Payer: Medicare Other | Admitting: Orthopedic Surgery

## 2020-01-20 ENCOUNTER — Other Ambulatory Visit: Payer: Self-pay

## 2020-01-20 DIAGNOSIS — S42292A Other displaced fracture of upper end of left humerus, initial encounter for closed fracture: Secondary | ICD-10-CM

## 2020-01-20 NOTE — Progress Notes (Signed)
Chief Complaint  Patient presents with  . Routine Post Op    Left humerus fractre DOI 11/21/19 , been treated for cellulitis at [facility]   [facility] 11/25/19   Encounter Diagnosis  Name Primary?  . Other closed displaced fracture of proximal end of left humerus, initial encounter 11/21/19 Yes    86 year old female status post occupational therapy for proximal humerus fracture left shoulder here for follow-up  She did not get therapy on a consistent basis presents back not complaining of major pain but she still in a sling that is bothering her neck a lot  She does have passive flexion 90 and abduction 90 no pain no tenderness at the fracture site  Recommend occupational physical therapy follow-up 2 months

## 2020-02-10 DIAGNOSIS — R2689 Other abnormalities of gait and mobility: Secondary | ICD-10-CM | POA: Diagnosis not present

## 2020-02-10 DIAGNOSIS — R110 Nausea: Secondary | ICD-10-CM | POA: Diagnosis not present

## 2020-02-10 DIAGNOSIS — M6281 Muscle weakness (generalized): Secondary | ICD-10-CM | POA: Diagnosis not present

## 2020-02-10 DIAGNOSIS — S42212D Unspecified displaced fracture of surgical neck of left humerus, subsequent encounter for fracture with routine healing: Secondary | ICD-10-CM | POA: Diagnosis not present

## 2020-02-13 DIAGNOSIS — R5383 Other fatigue: Secondary | ICD-10-CM | POA: Diagnosis not present

## 2020-02-13 DIAGNOSIS — R918 Other nonspecific abnormal finding of lung field: Secondary | ICD-10-CM | POA: Diagnosis not present

## 2020-02-13 DIAGNOSIS — L03114 Cellulitis of left upper limb: Secondary | ICD-10-CM | POA: Diagnosis not present

## 2020-02-13 DIAGNOSIS — S42212D Unspecified displaced fracture of surgical neck of left humerus, subsequent encounter for fracture with routine healing: Secondary | ICD-10-CM | POA: Diagnosis not present

## 2020-02-13 DIAGNOSIS — Z9181 History of falling: Secondary | ICD-10-CM | POA: Diagnosis not present

## 2020-02-13 DIAGNOSIS — Z880 Allergy status to penicillin: Secondary | ICD-10-CM | POA: Diagnosis not present

## 2020-02-13 DIAGNOSIS — H3530 Unspecified macular degeneration: Secondary | ICD-10-CM | POA: Diagnosis not present

## 2020-02-17 ENCOUNTER — Encounter (INDEPENDENT_AMBULATORY_CARE_PROVIDER_SITE_OTHER): Payer: Medicare Other | Admitting: Ophthalmology

## 2020-02-18 DIAGNOSIS — L03114 Cellulitis of left upper limb: Secondary | ICD-10-CM | POA: Diagnosis not present

## 2020-02-18 DIAGNOSIS — H3530 Unspecified macular degeneration: Secondary | ICD-10-CM | POA: Diagnosis not present

## 2020-02-18 DIAGNOSIS — S42212D Unspecified displaced fracture of surgical neck of left humerus, subsequent encounter for fracture with routine healing: Secondary | ICD-10-CM | POA: Diagnosis not present

## 2020-02-18 DIAGNOSIS — Z9181 History of falling: Secondary | ICD-10-CM | POA: Diagnosis not present

## 2020-02-20 DIAGNOSIS — Z0000 Encounter for general adult medical examination without abnormal findings: Secondary | ICD-10-CM | POA: Diagnosis not present

## 2020-02-20 DIAGNOSIS — M8000XD Age-related osteoporosis with current pathological fracture, unspecified site, subsequent encounter for fracture with routine healing: Secondary | ICD-10-CM | POA: Diagnosis not present

## 2020-02-20 DIAGNOSIS — R600 Localized edema: Secondary | ICD-10-CM | POA: Diagnosis not present

## 2020-02-20 DIAGNOSIS — Z6822 Body mass index (BMI) 22.0-22.9, adult: Secondary | ICD-10-CM | POA: Diagnosis not present

## 2020-02-20 DIAGNOSIS — I10 Essential (primary) hypertension: Secondary | ICD-10-CM | POA: Diagnosis not present

## 2020-02-26 DIAGNOSIS — Z9181 History of falling: Secondary | ICD-10-CM | POA: Diagnosis not present

## 2020-02-26 DIAGNOSIS — H3530 Unspecified macular degeneration: Secondary | ICD-10-CM | POA: Diagnosis not present

## 2020-02-26 DIAGNOSIS — L03114 Cellulitis of left upper limb: Secondary | ICD-10-CM | POA: Diagnosis not present

## 2020-02-26 DIAGNOSIS — S42212D Unspecified displaced fracture of surgical neck of left humerus, subsequent encounter for fracture with routine healing: Secondary | ICD-10-CM | POA: Diagnosis not present

## 2020-02-27 DIAGNOSIS — S42212D Unspecified displaced fracture of surgical neck of left humerus, subsequent encounter for fracture with routine healing: Secondary | ICD-10-CM | POA: Diagnosis not present

## 2020-02-27 DIAGNOSIS — Z9181 History of falling: Secondary | ICD-10-CM | POA: Diagnosis not present

## 2020-02-27 DIAGNOSIS — L03114 Cellulitis of left upper limb: Secondary | ICD-10-CM | POA: Diagnosis not present

## 2020-02-27 DIAGNOSIS — H3530 Unspecified macular degeneration: Secondary | ICD-10-CM | POA: Diagnosis not present

## 2020-02-28 DIAGNOSIS — L03114 Cellulitis of left upper limb: Secondary | ICD-10-CM | POA: Diagnosis not present

## 2020-02-28 DIAGNOSIS — S42212D Unspecified displaced fracture of surgical neck of left humerus, subsequent encounter for fracture with routine healing: Secondary | ICD-10-CM | POA: Diagnosis not present

## 2020-02-28 DIAGNOSIS — Z9181 History of falling: Secondary | ICD-10-CM | POA: Diagnosis not present

## 2020-02-28 DIAGNOSIS — H3530 Unspecified macular degeneration: Secondary | ICD-10-CM | POA: Diagnosis not present

## 2020-03-01 ENCOUNTER — Other Ambulatory Visit: Payer: Self-pay

## 2020-03-01 ENCOUNTER — Encounter (HOSPITAL_COMMUNITY): Payer: Self-pay | Admitting: Emergency Medicine

## 2020-03-01 ENCOUNTER — Emergency Department (HOSPITAL_COMMUNITY)
Admission: EM | Admit: 2020-03-01 | Discharge: 2020-03-01 | Disposition: A | Discharge status: Home / Self Care | Payer: Medicare Other | Attending: Emergency Medicine | Admitting: Emergency Medicine

## 2020-03-01 ENCOUNTER — Emergency Department (HOSPITAL_COMMUNITY): Payer: Medicare Other

## 2020-03-01 DIAGNOSIS — R1031 Right lower quadrant pain: Secondary | ICD-10-CM | POA: Diagnosis not present

## 2020-03-01 DIAGNOSIS — Z79899 Other long term (current) drug therapy: Secondary | ICD-10-CM | POA: Diagnosis not present

## 2020-03-01 DIAGNOSIS — R109 Unspecified abdominal pain: Secondary | ICD-10-CM

## 2020-03-01 LAB — DIFFERENTIAL
Abs Immature Granulocytes: 0.1 10*3/uL — ABNORMAL HIGH (ref 0.00–0.07)
Basophils Absolute: 0 10*3/uL (ref 0.0–0.1)
Basophils Relative: 0 %
Eosinophils Absolute: 0 10*3/uL (ref 0.0–0.5)
Eosinophils Relative: 0 %
Immature Granulocytes: 1 %
Lymphocytes Relative: 12 %
Lymphs Abs: 1.8 10*3/uL (ref 0.7–4.0)
Monocytes Absolute: 1.6 10*3/uL — ABNORMAL HIGH (ref 0.1–1.0)
Monocytes Relative: 11 %
Neutro Abs: 11.1 10*3/uL — ABNORMAL HIGH (ref 1.7–7.7)
Neutrophils Relative %: 76 %

## 2020-03-01 LAB — URINALYSIS, ROUTINE W REFLEX MICROSCOPIC
Bacteria, UA: NONE SEEN
Bilirubin Urine: NEGATIVE
Glucose, UA: NEGATIVE mg/dL
Hgb urine dipstick: NEGATIVE
Ketones, ur: NEGATIVE mg/dL
Nitrite: NEGATIVE
Protein, ur: 30 mg/dL — AB
Specific Gravity, Urine: 1.021 (ref 1.005–1.030)
pH: 5 (ref 5.0–8.0)

## 2020-03-01 LAB — CBC
HCT: 30.2 % — ABNORMAL LOW (ref 36.0–46.0)
Hemoglobin: 9.5 g/dL — ABNORMAL LOW (ref 12.0–15.0)
MCH: 28.7 pg (ref 26.0–34.0)
MCHC: 31.5 g/dL (ref 30.0–36.0)
MCV: 91.2 fL (ref 80.0–100.0)
Platelets: 279 10*3/uL (ref 150–400)
RBC: 3.31 MIL/uL — ABNORMAL LOW (ref 3.87–5.11)
RDW: 14 % (ref 11.5–15.5)
WBC: 14.7 10*3/uL — ABNORMAL HIGH (ref 4.0–10.5)
nRBC: 0 % (ref 0.0–0.2)

## 2020-03-01 LAB — BASIC METABOLIC PANEL
Anion gap: 7 (ref 5–15)
BUN: 16 mg/dL (ref 8–23)
CO2: 27 mmol/L (ref 22–32)
Calcium: 8.2 mg/dL — ABNORMAL LOW (ref 8.9–10.3)
Chloride: 100 mmol/L (ref 98–111)
Creatinine, Ser: 0.78 mg/dL (ref 0.44–1.00)
GFR calc Af Amer: >60 mL/min (ref >60)
GFR calc non Af Amer: >60 mL/min (ref >60)
Glucose, Bld: 137 mg/dL — ABNORMAL HIGH (ref 70–99)
Potassium: 4.5 mmol/L (ref 3.5–5.1)
Sodium: 134 mmol/L — ABNORMAL LOW (ref 135–145)

## 2020-03-01 LAB — HEPATIC FUNCTION PANEL
ALT: 11 U/L (ref 0–44)
AST: 14 U/L — ABNORMAL LOW (ref 15–41)
Albumin: 2.6 g/dL — ABNORMAL LOW (ref 3.5–5.0)
Alkaline Phosphatase: 62 U/L (ref 38–126)
Bilirubin, Direct: 0.1 mg/dL (ref 0.0–0.2)
Indirect Bilirubin: 0.6 mg/dL (ref 0.3–0.9)
Total Bilirubin: 0.7 mg/dL (ref 0.3–1.2)
Total Protein: 6 g/dL — ABNORMAL LOW (ref 6.5–8.1)

## 2020-03-01 MED ORDER — CIPROFLOXACIN HCL 500 MG PO TABS
500.0000 mg | ORAL_TABLET | Freq: Two times a day (BID) | ORAL | 0 refills | Status: DC
Start: 2020-03-01 — End: 2020-03-14

## 2020-03-01 MED ORDER — CIPROFLOXACIN HCL 250 MG PO TABS
500.0000 mg | ORAL_TABLET | Freq: Once | ORAL | Status: AC
  Administered 2020-03-01: 500 mg via ORAL
  Filled 2020-03-01: qty 2

## 2020-03-01 NOTE — Discharge Instructions (Signed)
Follow-up with your family doctor this week for recheck 

## 2020-03-01 NOTE — ED Triage Notes (Addendum)
Pt C/O right sided flank pain that started 2 days ago. Pt reports being unable to control her urine. Denies burning or trouble urinating. Family reports a fever of 100.7 at home and was given tylenol at 1730.

## 2020-03-01 NOTE — ED Provider Notes (Signed)
[facility] EMERGENCY DEPARTMENT Provider Note   CSN: [id number] Arrival date & time: 03/01/20  2047     History Chief Complaint  Patient presents with  . Flank Pain    Joyce Hogan is a 86 y.o. female.  Patient with right flank pain and some right groin pain.  The history is provided by the patient and a relative. No language interpreter was used.  Flank Pain This is a new problem. The current episode started 12 to 24 hours ago. The problem occurs rarely. The problem has been resolved. Pertinent negatives include no chest pain, no abdominal pain and no headaches. Nothing aggravates the symptoms. Nothing relieves the symptoms. She has tried nothing for the symptoms. The treatment provided no relief.       History reviewed. No pertinent past medical history.  Patient Active Problem List   Diagnosis Date Noted  . Closed fracture of left proximal humerus 12/04/2019    Past Surgical History:  Procedure Laterality Date  . ABDOMINAL HYSTERECTOMY    . CATARACT EXTRACTION W/PHACO Right 01/04/2016   Procedure: CATARACT EXTRACTION PHACO AND INTRAOCULAR LENS PLACEMENT (IOC);  Surgeon: Tonny Branch, MD;  Location: [facility];  Service: Ophthalmology;  Laterality: Right;  CDE 9.06  . CATARACT EXTRACTION W/PHACO Left 01/21/2016   Procedure: CATARACT EXTRACTION PHACO AND INTRAOCULAR LENS PLACEMENT (IOC);  Surgeon: Tonny Branch, MD;  Location: [facility];  Service: Ophthalmology;  Laterality: Left;  CDE: 12.65  . TONSILLECTOMY       OB History   No obstetric history on file.     Family History  Problem Relation Age of Onset  . Dementia Mother     Social History   Tobacco Use  . Smoking status: Never Smoker  . Smokeless tobacco: Never Used  Substance Use Topics  . Alcohol use: No  . Drug use: No    Home Medications Prior to Admission medications   Medication Sig Start Date End Date Taking? Authorizing Provider  acetaminophen (TYLENOL) 325 MG tablet Take 650 mg by mouth every 6 (six)  hours as needed for mild pain or moderate pain. ALL PILLS ARE CRUSHED-MIXED IN APPLESAUCE   Yes [provider]  Calcium-Vitamin D-Vitamin K (VIACTIV CALCIUM PLUS D) 650-12.5-40 MG-MCG-MCG CHEW Chew 1 each by mouth in the morning and at bedtime.   Yes [provider]  diltiazem (CARDIZEM) 30 MG tablet Take 30 mg by mouth daily. ALL PILLS ARE CRUSHED-MIXED IN APPLESAUCE 02/20/20  Yes [provider]  Multiple Vitamins-Minerals (PRESERVISION AREDS 2) CHEW Chew 1 tablet by mouth in the morning and at bedtime.   Yes [provider]  potassium chloride 20 MEQ/15ML (10%) SOLN Take 20 mEq by mouth daily. 02/14/20  Yes [provider]  ciprofloxacin (CIPRO) 500 MG tablet Take 1 tablet (500 mg total) by mouth 2 (two) times daily. One po bid x 7 days 03/01/20   Milton Ferguson, MD    Allergies    Penicillins  Review of Systems   Review of Systems  Constitutional: Negative for appetite change and fatigue.  HENT: Negative for congestion, ear discharge and sinus pressure.   Eyes: Negative for discharge.  Respiratory: Negative for cough.   Cardiovascular: Negative for chest pain.  Gastrointestinal: Negative for abdominal pain and diarrhea.  Genitourinary: Positive for flank pain. Negative for frequency and hematuria.  Musculoskeletal: Negative for back pain.  Skin: Negative for rash.  Neurological: Negative for seizures and headaches.  Psychiatric/Behavioral: Negative for hallucinations.    Physical Exam Updated Vital  Signs BP 104/62 (BP Location: Right Arm)   Pulse (!) 106   Temp 98.6 F (37 C) (Oral)   Resp 17   Wt 51.7 kg   SpO2 95%   BMI 20.85 kg/m   Physical Exam Vitals and nursing note reviewed.  Constitutional:      Appearance: She is well-developed.  HENT:     Head: Normocephalic.     Nose: Nose normal.  Eyes:     General: No scleral icterus.    Conjunctiva/sclera: Conjunctivae normal.  Neck:     Thyroid: No thyromegaly.    Cardiovascular:     Rate and Rhythm: Normal rate and regular rhythm.     Heart sounds: No murmur. No friction rub. No gallop.   Pulmonary:     Breath sounds: No stridor. No wheezing or rales.  Chest:     Chest wall: No tenderness.  Abdominal:     General: There is no distension.     Tenderness: There is no abdominal tenderness. There is no rebound.  Musculoskeletal:        General: Normal range of motion.     Cervical back: Neck supple.  Lymphadenopathy:     Cervical: No cervical adenopathy.  Skin:    Findings: No erythema or rash.  Neurological:     Mental Status: She is alert and oriented to person, place, and time.     Motor: No abnormal muscle tone.     Coordination: Coordination normal.  Psychiatric:        Behavior: Behavior normal.     ED Results / Procedures / Treatments   Labs (all labs ordered are listed, but only abnormal results are displayed) Labs Reviewed  URINALYSIS, ROUTINE W REFLEX MICROSCOPIC - Abnormal; Notable for the following components:      Result Value   APPearance HAZY (*)    Protein, ur 30 (*)    Leukocytes,Ua TRACE (*)    All other components within normal limits  CBC - Abnormal; Notable for the following components:   WBC 14.7 (*)    RBC 3.31 (*)    Hemoglobin 9.5 (*)    HCT 30.2 (*)    All other components within normal limits  BASIC METABOLIC PANEL - Abnormal; Notable for the following components:   Sodium 134 (*)    Glucose, Bld 137 (*)    Calcium 8.2 (*)    All other components within normal limits  DIFFERENTIAL - Abnormal; Notable for the following components:   Neutro Abs 11.1 (*)    Monocytes Absolute 1.6 (*)    Abs Immature Granulocytes 0.10 (*)    All other components within normal limits  HEPATIC FUNCTION PANEL - Abnormal; Notable for the following components:   Total Protein 6.0 (*)    Albumin 2.6 (*)    AST 14 (*)    All other components within normal limits  URINE CULTURE    EKG None  Radiology CT Renal Stone  Study  Result Date: 03/01/2020 CLINICAL DATA:  Right flank pain for 2 days EXAM: CT ABDOMEN AND PELVIS WITHOUT CONTRAST TECHNIQUE: Multidetector CT imaging of the abdomen and pelvis was performed following the standard protocol without IV contrast. COMPARISON:  Chest radiograph 12/08/2019, 02/13/2020 FINDINGS: Lower chest: Bibasilar airways thickening and diffuse secretions with more mixed ground-glass and consolidative opacity in the right lower lobe as well as a more consolidative opacity in the periphery of the left lung base as well which could reflect further infection or round atelectasis though  an underlying pleural abnormality is not well demonstrated. Mild cardiomegaly. Trace pericardial effusion. Dense calcifications of the included thoracic aorta and aortic valve leaflets as well as the coronary arteries. Hepatobiliary: Smooth liver surface contour. No visible focal liver lesion. Gallbladder demonstrates multiple prominent fold but without pericholecystic inflammation. No visible calcified intraductal gallstones or biliary dilatation. Pancreas: Unremarkable. No pancreatic ductal dilatation or surrounding inflammatory changes. Spleen: Normal in size without focal abnormality. Adrenals/Urinary Tract: Normal adrenal glands 4 cm fluid attenuation cyst seen in the lower pole left kidney. Additional partially exophytic 1.3 cm. Indeterminate intermediate attenuation cyst measuring 31 HU seen arising from the posterior interpolar left kidney (2/29). Incompletely characterized on this exam. Several smaller subcentimeter hyperattenuating foci are present as well presumably hyperdense cysts (2/26-27). Additional subcentimeter hypoattenuating foci in the kidneys too small to fully characterize on CT imaging but statistically likely benign. Nonobstructing calculi present in the lower poles both kidneys. No visible obstructive urolithiasis along the course of either ureter. Extensive pelvic phleboliths are noted.  Circumferential bladder wall thickening, greater than expected for underdistention. Stomach/Bowel: Small hiatal hernia. Distal stomach is unremarkable. Large air filled duodenal diverticulum (5/40) measuring up to 5 cm in size. Duodenum otherwise unremarkable normally crossing the midline abdomen. No small bowel dilatation or wall thickening. Appendix is not visualized. No focal inflammation the vicinity of the cecum to suggest an occult appendicitis. Moderate colonic stool burden. Portion of the sigmoid colon protruding into a left inguinal hernia without evidence of mechanical bowel obstruction or vascular compromise. Large inspissated rectal stool ball mural thickening and presacral fat stranding measuring up to 8.5 cm in maximal diameter (2/65). Vascular/Lymphatic: Atherosclerotic calcifications throughout the abdominal aorta and branch vessels. No aneurysm or ectasia. No enlarged abdominopelvic lymph nodes. Reproductive: Uterus is surgically absent. 3.6 cm cyst seen in the left ovary. The right ovarian tissue is poorly visualized. Other: Fat and bowel containing left groin hernia. No abdominopelvic free air or fluid. Presacral fat stranding, as above. Musculoskeletal: Multilevel degenerative changes are present in the imaged portions of the spine. Likely remote compression deformity of T12 with approximately 40% height loss. Likely vertebral body hemangiomata at the L2 and 3 levels. Levocurvature of the lumbar spine, apex L3-4. Minimal compensatory dextrocurvature of the lower thoracic spine. Asymmetric sclerosis of the right SI joint may reflect sequela of prior sacroiliitis. IMPRESSION: 1. Large inspissated rectal stool ball with mural thickening and presacral fat stranding measuring up to 8.5 cm in maximal diameter. Findings are concerning for stercoral colitis. 2. Fat and sigmoid colon containing left groin hernia without evidence of vascular compromise or mechanical obstruction. 3. Circumferential bladder  wall thickening, greater than expected for underdistention. Recommend correlation with urinalysis to exclude cystitis. 4. Bibasilar airways thickening and diffuse secretions with more mixed ground-glass and consolidative opacity in the right lower lobe as well as a more consolidative opacity in the periphery of the left lung base as well which could reflect further infection or round atelectasis though an underlying pleural abnormality is not well demonstrated. Recommend follow-up chest CT in 6-8 weeks following appropriate therapy to ensure resolution. 5. Nonobstructing bilateral nephrolithiasis. No visible obstructive urolithiasis along the course of either ureter. 6. Indeterminate intermediate attenuation cyst in the posterior left interpolar kidney. Consider outpatient evaluation with nonemergent renal ultrasound. 7. 3.6 cm left ovarian cyst. Recommend further evaluation with pelvic ultrasound on a nonemergent basis. This recommendation follows ACR consensus guidelines: White Paper of the ACR Incidental Findings Committee II on Adnexal Findings. J Am Coll Radiol [phone].  8. Likely remote compression deformity of T12 with approximately 40% height loss. 9. 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 10. Asymmetric sclerosis of the right SI joint may reflect sequela of prior sacroiliitis though could correlate for acute symptoms 11. Aortic Atherosclerosis (ICD10-I70.0). Electronically Signed   By: Lovena Le M.D.   On: 03/01/2020 23:12    Procedures Procedures (including critical care time)  Medications Ordered in ED Medications  ciprofloxacin (CIPRO) tablet 500 mg (has no administration in time range)    ED Course  I have reviewed the triage vital signs and the nursing notes.  Pertinent labs & imaging results that were available during my care of the patient were reviewed by me and considered in my medical decision making (see chart for details).    MDM Rules/Calculators/A&P                       Labs suggest urinary tract infection.  On exam at discharge.  Patient having no pain.  She also states she has had no constipation.  CT scan does show large amount of stool in the colon but patient states she is not constipated.     patient is nontoxic and will be discharged home on some Cipro for possible urinary tract infection she will get a urine culture done.  She is given a copy of our labs and x-ray reports so her family doctor can review them and follow-up in the next 2 to 3 days      This patient presents to the ED for concern of flank pain this involves an extensive number of treatment options, and is a complaint that carries with it a high risk of complications and morbidity.  The differential diagnosis includes UTI or kidney stone   Lab Tests:   I Ordered, reviewed, and interpreted labs, which included CBC chemistries urinalysis.  Patient with elevated white blood count and possible urinary tract infection  Medicines ordered:   I ordered medication Cipro for UTI  Imaging Studies ordered:   I ordered imaging studies which included CT of the abdomen and  I independently visualized and interpreted imaging which showed large stool in the rectum  Additional history obtained:   Additional history obtained from daughter  Previous records obtained and reviewed   Consultations Obtained:   Reevaluation:  After the interventions stated above, I reevaluated the patient and found improved  Critical Interventions:  .   Final Clinical Impression(s) / ED Diagnoses Final diagnoses:  Flank pain    Rx / DC Orders ED Discharge Orders         Ordered    ciprofloxacin (CIPRO) 500 MG tablet  2 times daily     03/01/20 2323           Milton Ferguson, MD 03/02/20 1056

## 2020-03-03 DIAGNOSIS — L03114 Cellulitis of left upper limb: Secondary | ICD-10-CM | POA: Diagnosis not present

## 2020-03-03 DIAGNOSIS — Z9181 History of falling: Secondary | ICD-10-CM | POA: Diagnosis not present

## 2020-03-03 DIAGNOSIS — S42212D Unspecified displaced fracture of surgical neck of left humerus, subsequent encounter for fracture with routine healing: Secondary | ICD-10-CM | POA: Diagnosis not present

## 2020-03-03 DIAGNOSIS — H3530 Unspecified macular degeneration: Secondary | ICD-10-CM | POA: Diagnosis not present

## 2020-03-03 LAB — URINE CULTURE: Culture: NO GROWTH

## 2020-03-06 DIAGNOSIS — Z9181 History of falling: Secondary | ICD-10-CM | POA: Diagnosis not present

## 2020-03-06 DIAGNOSIS — S42212D Unspecified displaced fracture of surgical neck of left humerus, subsequent encounter for fracture with routine healing: Secondary | ICD-10-CM | POA: Diagnosis not present

## 2020-03-06 DIAGNOSIS — H3530 Unspecified macular degeneration: Secondary | ICD-10-CM | POA: Diagnosis not present

## 2020-03-06 DIAGNOSIS — L03114 Cellulitis of left upper limb: Secondary | ICD-10-CM | POA: Diagnosis not present

## 2020-03-11 ENCOUNTER — Other Ambulatory Visit: Payer: Self-pay

## 2020-03-11 ENCOUNTER — Inpatient Hospital Stay (HOSPITAL_COMMUNITY)
Admission: RE | Admit: 2020-03-11 | Discharge: 2020-03-14 | DRG: 521 | Disposition: A | Discharge status: Skilled Nursing Facility | Payer: Medicare Other | Attending: Family Medicine | Admitting: Family Medicine

## 2020-03-11 ENCOUNTER — Encounter (HOSPITAL_COMMUNITY): Payer: Self-pay | Admitting: Emergency Medicine

## 2020-03-11 ENCOUNTER — Emergency Department (HOSPITAL_COMMUNITY): Payer: Medicare Other

## 2020-03-11 DIAGNOSIS — S72002A Fracture of unspecified part of neck of left femur, initial encounter for closed fracture: Secondary | ICD-10-CM

## 2020-03-11 DIAGNOSIS — I361 Nonrheumatic tricuspid (valve) insufficiency: Secondary | ICD-10-CM | POA: Diagnosis not present

## 2020-03-11 DIAGNOSIS — I10 Essential (primary) hypertension: Secondary | ICD-10-CM | POA: Diagnosis not present

## 2020-03-11 DIAGNOSIS — Z20822 Contact with and (suspected) exposure to covid-19: Secondary | ICD-10-CM | POA: Diagnosis present

## 2020-03-11 DIAGNOSIS — W19XXXA Unspecified fall, initial encounter: Secondary | ICD-10-CM

## 2020-03-11 DIAGNOSIS — D649 Anemia, unspecified: Secondary | ICD-10-CM | POA: Diagnosis present

## 2020-03-11 DIAGNOSIS — M6281 Muscle weakness (generalized): Secondary | ICD-10-CM | POA: Diagnosis not present

## 2020-03-11 DIAGNOSIS — R296 Repeated falls: Secondary | ICD-10-CM

## 2020-03-11 DIAGNOSIS — J939 Pneumothorax, unspecified: Secondary | ICD-10-CM | POA: Diagnosis not present

## 2020-03-11 DIAGNOSIS — Y92009 Unspecified place in unspecified non-institutional (private) residence as the place of occurrence of the external cause: Secondary | ICD-10-CM

## 2020-03-11 DIAGNOSIS — D62 Acute posthemorrhagic anemia: Secondary | ICD-10-CM | POA: Diagnosis not present

## 2020-03-11 DIAGNOSIS — Z9841 Cataract extraction status, right eye: Secondary | ICD-10-CM | POA: Diagnosis not present

## 2020-03-11 DIAGNOSIS — I4510 Unspecified right bundle-branch block: Secondary | ICD-10-CM | POA: Diagnosis not present

## 2020-03-11 DIAGNOSIS — S72012A Unspecified intracapsular fracture of left femur, initial encounter for closed fracture: Secondary | ICD-10-CM | POA: Diagnosis not present

## 2020-03-11 DIAGNOSIS — Z90710 Acquired absence of both cervix and uterus: Secondary | ICD-10-CM | POA: Diagnosis not present

## 2020-03-11 DIAGNOSIS — Z96649 Presence of unspecified artificial hip joint: Secondary | ICD-10-CM

## 2020-03-11 DIAGNOSIS — I340 Nonrheumatic mitral (valve) insufficiency: Secondary | ICD-10-CM | POA: Diagnosis not present

## 2020-03-11 DIAGNOSIS — R262 Difficulty in walking, not elsewhere classified: Secondary | ICD-10-CM | POA: Diagnosis not present

## 2020-03-11 DIAGNOSIS — I959 Hypotension, unspecified: Secondary | ICD-10-CM | POA: Diagnosis not present

## 2020-03-11 DIAGNOSIS — H3530 Unspecified macular degeneration: Secondary | ICD-10-CM | POA: Diagnosis not present

## 2020-03-11 DIAGNOSIS — Z96642 Presence of left artificial hip joint: Secondary | ICD-10-CM | POA: Diagnosis not present

## 2020-03-11 DIAGNOSIS — Z03818 Encounter for observation for suspected exposure to other biological agents ruled out: Secondary | ICD-10-CM | POA: Diagnosis not present

## 2020-03-11 DIAGNOSIS — Z471 Aftercare following joint replacement surgery: Secondary | ICD-10-CM | POA: Diagnosis not present

## 2020-03-11 DIAGNOSIS — Z961 Presence of intraocular lens: Secondary | ICD-10-CM | POA: Diagnosis present

## 2020-03-11 DIAGNOSIS — I351 Nonrheumatic aortic (valve) insufficiency: Secondary | ICD-10-CM | POA: Diagnosis not present

## 2020-03-11 DIAGNOSIS — J9601 Acute respiratory failure with hypoxia: Secondary | ICD-10-CM | POA: Diagnosis present

## 2020-03-11 DIAGNOSIS — S72009A Fracture of unspecified part of neck of unspecified femur, initial encounter for closed fracture: Secondary | ICD-10-CM

## 2020-03-11 DIAGNOSIS — Z79899 Other long term (current) drug therapy: Secondary | ICD-10-CM

## 2020-03-11 DIAGNOSIS — M159 Polyosteoarthritis, unspecified: Secondary | ICD-10-CM | POA: Diagnosis not present

## 2020-03-11 DIAGNOSIS — Z9842 Cataract extraction status, left eye: Secondary | ICD-10-CM | POA: Diagnosis not present

## 2020-03-11 DIAGNOSIS — W1830XA Fall on same level, unspecified, initial encounter: Secondary | ICD-10-CM | POA: Diagnosis present

## 2020-03-11 DIAGNOSIS — I083 Combined rheumatic disorders of mitral, aortic and tricuspid valves: Secondary | ICD-10-CM | POA: Diagnosis not present

## 2020-03-11 DIAGNOSIS — Z743 Need for continuous supervision: Secondary | ICD-10-CM | POA: Diagnosis not present

## 2020-03-11 DIAGNOSIS — Z9181 History of falling: Secondary | ICD-10-CM | POA: Diagnosis not present

## 2020-03-11 DIAGNOSIS — S42202D Unspecified fracture of upper end of left humerus, subsequent encounter for fracture with routine healing: Secondary | ICD-10-CM | POA: Diagnosis not present

## 2020-03-11 DIAGNOSIS — I480 Paroxysmal atrial fibrillation: Secondary | ICD-10-CM | POA: Diagnosis present

## 2020-03-11 DIAGNOSIS — R0902 Hypoxemia: Secondary | ICD-10-CM | POA: Diagnosis not present

## 2020-03-11 DIAGNOSIS — S72002D Fracture of unspecified part of neck of left femur, subsequent encounter for closed fracture with routine healing: Secondary | ICD-10-CM | POA: Diagnosis not present

## 2020-03-11 DIAGNOSIS — Z4789 Encounter for other orthopedic aftercare: Secondary | ICD-10-CM | POA: Diagnosis not present

## 2020-03-11 DIAGNOSIS — Z880 Allergy status to penicillin: Secondary | ICD-10-CM

## 2020-03-11 DIAGNOSIS — R52 Pain, unspecified: Secondary | ICD-10-CM | POA: Diagnosis not present

## 2020-03-11 LAB — SARS CORONAVIRUS 2 BY RT PCR (HOSPITAL ORDER, PERFORMED IN ~~LOC~~ HOSPITAL LAB): SARS Coronavirus 2: NEGATIVE

## 2020-03-11 LAB — PROTIME-INR
INR: 1.1 (ref 0.8–1.2)
Prothrombin Time: 13.8 seconds (ref 11.4–15.2)

## 2020-03-11 LAB — BASIC METABOLIC PANEL
Anion gap: 11 (ref 5–15)
BUN: 18 mg/dL (ref 8–23)
CO2: 26 mmol/L (ref 22–32)
Calcium: 8.8 mg/dL — ABNORMAL LOW (ref 8.9–10.3)
Chloride: 100 mmol/L (ref 98–111)
Creatinine, Ser: 0.71 mg/dL (ref 0.44–1.00)
GFR calc Af Amer: >60 mL/min (ref >60)
GFR calc non Af Amer: >60 mL/min (ref >60)
Glucose, Bld: 116 mg/dL — ABNORMAL HIGH (ref 70–99)
Potassium: 3.9 mmol/L (ref 3.5–5.1)
Sodium: 137 mmol/L (ref 135–145)

## 2020-03-11 LAB — CBC WITH DIFFERENTIAL/PLATELET
Abs Immature Granulocytes: 0.12 10*3/uL — ABNORMAL HIGH (ref 0.00–0.07)
Basophils Absolute: 0 10*3/uL (ref 0.0–0.1)
Basophils Relative: 0 %
Eosinophils Absolute: 0.2 10*3/uL (ref 0.0–0.5)
Eosinophils Relative: 3 %
HCT: 30.5 % — ABNORMAL LOW (ref 36.0–46.0)
Hemoglobin: 9.6 g/dL — ABNORMAL LOW (ref 12.0–15.0)
Immature Granulocytes: 2 %
Lymphocytes Relative: 27 %
Lymphs Abs: 1.7 10*3/uL (ref 0.7–4.0)
MCH: 28.9 pg (ref 26.0–34.0)
MCHC: 31.5 g/dL (ref 30.0–36.0)
MCV: 91.9 fL (ref 80.0–100.0)
Monocytes Absolute: 0.8 10*3/uL (ref 0.1–1.0)
Monocytes Relative: 12 %
Neutro Abs: 3.4 10*3/uL (ref 1.7–7.7)
Neutrophils Relative %: 56 %
Platelets: 293 10*3/uL (ref 150–400)
RBC: 3.32 MIL/uL — ABNORMAL LOW (ref 3.87–5.11)
RDW: 14.1 % (ref 11.5–15.5)
WBC: 6.2 10*3/uL (ref 4.0–10.5)
nRBC: 0 % (ref 0.0–0.2)

## 2020-03-11 LAB — TSH: TSH: 0.298 u[IU]/mL — ABNORMAL LOW (ref 0.350–4.500)

## 2020-03-11 LAB — SURGICAL PCR SCREEN
MRSA, PCR: NEGATIVE
Staphylococcus aureus: NEGATIVE

## 2020-03-11 LAB — ABO/RH: ABO/RH(D): A POS

## 2020-03-11 MED ORDER — METHOCARBAMOL 500 MG PO TABS
500.0000 mg | ORAL_TABLET | Freq: Three times a day (TID) | ORAL | Status: DC
  Administered 2020-03-11 – 2020-03-14 (×9): 500 mg via ORAL
  Filled 2020-03-11 (×9): qty 1

## 2020-03-11 MED ORDER — VANCOMYCIN HCL IN DEXTROSE 1-5 GM/200ML-% IV SOLN
1000.0000 mg | INTRAVENOUS | Status: AC
  Administered 2020-03-12: 1000 mg via INTRAVENOUS
  Filled 2020-03-11: qty 200

## 2020-03-11 MED ORDER — FENTANYL CITRATE (PF) 100 MCG/2ML IJ SOLN
30.0000 ug | INTRAMUSCULAR | Status: DC | PRN
  Administered 2020-03-11 (×4): 30 ug via INTRAVENOUS
  Filled 2020-03-11 (×5): qty 2

## 2020-03-11 MED ORDER — SODIUM CHLORIDE 0.9% IV SOLUTION: Freq: Once | INTRAVENOUS | Status: DC

## 2020-03-11 MED ORDER — HEPARIN SODIUM (PORCINE) 5000 UNIT/ML IJ SOLN: 5000.0000 [IU] | Freq: Once | INTRAMUSCULAR | Status: DC

## 2020-03-11 MED ORDER — HEPARIN SODIUM (PORCINE) 5000 UNIT/ML IJ SOLN
5000.0000 [IU] | Freq: Three times a day (TID) | INTRAMUSCULAR | Status: DC
  Administered 2020-03-11 – 2020-03-14 (×7): 5000 [IU] via SUBCUTANEOUS
  Filled 2020-03-11 (×7): qty 1

## 2020-03-11 MED ORDER — OXYCODONE HCL 5 MG PO TABS
5.0000 mg | ORAL_TABLET | ORAL | Status: DC | PRN
  Administered 2020-03-11: 5 mg via ORAL
  Filled 2020-03-11: qty 1

## 2020-03-11 MED ORDER — POVIDONE-IODINE 10 % EX SWAB: 2.0000 "application " | Freq: Once | CUTANEOUS | Status: DC

## 2020-03-11 MED ORDER — ASPIRIN EC 81 MG PO TBEC
81.0000 mg | DELAYED_RELEASE_TABLET | Freq: Every day | ORAL | Status: DC
  Administered 2020-03-11: 81 mg via ORAL
  Filled 2020-03-11 (×2): qty 1

## 2020-03-11 MED ORDER — FENTANYL CITRATE (PF) 100 MCG/2ML IJ SOLN
12.5000 ug | INTRAMUSCULAR | Status: DC | PRN
  Administered 2020-03-12: 25 ug via INTRAVENOUS
  Filled 2020-03-11: qty 2

## 2020-03-11 MED ORDER — FENTANYL CITRATE (PF) 100 MCG/2ML IJ SOLN
30.0000 ug | Freq: Once | INTRAMUSCULAR | Status: AC
  Administered 2020-03-11: 30 ug via INTRAVENOUS

## 2020-03-11 MED ORDER — MUPIROCIN 2 % EX OINT
1.0000 "application " | TOPICAL_OINTMENT | Freq: Two times a day (BID) | CUTANEOUS | Status: DC
  Administered 2020-03-11 – 2020-03-14 (×5): 1 via NASAL
  Filled 2020-03-11: qty 22

## 2020-03-11 MED ORDER — CHLORHEXIDINE GLUCONATE 4 % EX LIQD
60.0000 mL | Freq: Once | CUTANEOUS | Status: AC
  Administered 2020-03-12: 4 via TOPICAL
  Filled 2020-03-11: qty 60

## 2020-03-11 MED ORDER — METOPROLOL TARTRATE 25 MG PO TABS
12.5000 mg | ORAL_TABLET | Freq: Two times a day (BID) | ORAL | Status: DC
  Administered 2020-03-11 – 2020-03-13 (×3): 12.5 mg via ORAL
  Filled 2020-03-11 (×3): qty 1

## 2020-03-11 MED ORDER — SODIUM CHLORIDE 0.9 % IV SOLN: INTRAVENOUS | Status: DC

## 2020-03-11 NOTE — ED Notes (Signed)
Dr. Denton Brick notified of bladder scan result.

## 2020-03-11 NOTE — ED Notes (Signed)
ED TO INPATIENT HANDOFF REPORT  ED Nurse Name and Phone #: [phone]  S Name/Age/Gender Joyce Hogan 86 y.o. female Room/Bed: [facility]03/[facility]03  Code Status   Code Status: Not on file  Home/SNF/Other Home Patient oriented to: self Is this baseline? Yes   Triage Complete: Triage complete  Chief Complaint Hip fracture [facility]) [S72.009A]  Triage Note [facility] this am going to [facility],  Pt says she did not have her walker.  Injury to left hip.  Pt has external rotation and shorting of left leg. Rates pain 5/10.    Pt says she fail to have her walker and slipped on hard wood floor.      Allergies Allergies  Allergen Reactions  . Penicillins     unknown    Level of Care/Admitting Diagnosis ED Disposition    ED Disposition Condition [facility] Area: [facility] [[id number]]  Level of Care: Med-Surg [16]  Covid Evaluation: Asymptomatic Screening Protocol (No Symptoms)  Admission Type: Elective [3]  Diagnosis: Hip fracture [facility]) [[id number]]  Admitting Physician: Morrison Old  Attending Physician: Morrison Old  Estimated length of stay: 3 - 4 days  Certification:: I certify this patient will need inpatient services for at least 2 midnights       B Medical/Surgery History History reviewed. No pertinent past medical history. Past Surgical History:  Procedure Laterality Date  . ABDOMINAL HYSTERECTOMY    . CATARACT EXTRACTION W/PHACO Right 01/04/2016   Procedure: CATARACT EXTRACTION PHACO AND INTRAOCULAR LENS PLACEMENT (IOC);  Surgeon: Tonny Branch, MD;  Location: [facility];  Service: Ophthalmology;  Laterality: Right;  CDE 9.06  . CATARACT EXTRACTION W/PHACO Left 01/21/2016   Procedure: CATARACT EXTRACTION PHACO AND INTRAOCULAR LENS PLACEMENT (IOC);  Surgeon: Tonny Branch, MD;  Location: [facility];  Service: Ophthalmology;  Laterality: Left;  CDE: 12.65  . TONSILLECTOMY       A IV Location/Drains/Wounds Patient Lines/Drains/Airways Status    Active  Line/Drains/Airways    Name Placement date Placement time Site Days   Peripheral IV 03/11/20 Left Forearm 03/11/20  0805  Forearm  less than 1   External Urinary Catheter 03/11/20  0722  --  less than 1          Intake/Output Last 24 hours No intake or output data in the 24 hours ending 03/11/20 1445  Labs/Imaging Results for orders placed or performed during the hospital encounter of 03/11/20 (from the past 48 hour(s))  Basic metabolic panel     Status: Abnormal   Collection Time: 03/11/20  7:26 AM  Result Value Ref Range   Sodium 137 135 - 145 mmol/L   Potassium 3.9 3.5 - 5.1 mmol/L   Chloride 100 98 - 111 mmol/L   CO2 26 22 - 32 mmol/L   Glucose, Bld 116 (H) 70 - 99 mg/dL    Comment: Glucose reference range applies only to samples taken after fasting for at least 8 hours.   BUN 18 8 - 23 mg/dL   Creatinine, Ser 0.71 0.44 - 1.00 mg/dL   Calcium 8.8 (L) 8.9 - 10.3 mg/dL   GFR calc non Af Amer >60 >60 mL/min   GFR calc Af Amer >60 >60 mL/min   Anion gap 11 5 - 15    Comment: Performed at [facility], [address] [place], [place] [address]  CBC WITH DIFFERENTIAL     Status: Abnormal   Collection Time: 03/11/20  7:26 AM  Result Value Ref Range   WBC 6.2  4.0 - 10.5 K/uL   RBC 3.32 (L) 3.87 - 5.11 MIL/uL   Hemoglobin 9.6 (L) 12.0 - 15.0 g/dL   HCT 30.5 (L) 36 - 46 %   MCV 91.9 80.0 - 100.0 fL   MCH 28.9 26.0 - 34.0 pg   MCHC 31.5 30.0 - 36.0 g/dL   RDW 14.1 11.5 - 15.5 %   Platelets 293 150 - 400 K/uL   nRBC 0.0 0.0 - 0.2 %   Neutrophils Relative % 56 %   Neutro Abs 3.4 1.7 - 7.7 K/uL   Lymphocytes Relative 27 %   Lymphs Abs 1.7 0.7 - 4.0 K/uL   Monocytes Relative 12 %   Monocytes Absolute 0.8 0 - 1 K/uL   Eosinophils Relative 3 %   Eosinophils Absolute 0.2 0 - 0 K/uL   Basophils Relative 0 %   Basophils Absolute 0.0 0 - 0 K/uL   Immature Granulocytes 2 %   Abs Immature Granulocytes 0.12 (H) 0.00 - 0.07 K/uL    Comment: Performed at [facility], [address], [place], [place] [address]  Protime-INR     Status: None   Collection Time: 03/11/20  7:26 AM  Result Value Ref Range   Prothrombin Time 13.8 11.4 - 15.2 seconds   INR 1.1 0.8 - 1.2    Comment: (NOTE) INR goal varies based on device and disease states. Performed at [facility], [address] [place], [place] [address]   Type and screen [facility]     Status: None   Collection Time: 03/11/20  7:34 AM  Result Value Ref Range   ABO/RH(D) A POS    Antibody Screen NEG    Sample Expiration      03/14/2020,2359 Performed at [facility], [address] [place], [place] [address]   ABO/Rh     Status: None   Collection Time: 03/11/20  7:34 AM  Result Value Ref Range   ABO/RH(D)      A POS Performed at [place], [address], [place],  [address]   SARS Coronavirus 2 by RT PCR (hospital order, performed in [facility] hospital lab) Nasopharyngeal Nasopharyngeal Swab     Status: None   Collection Time: 03/11/20  8:20 AM   Specimen: Nasopharyngeal Swab  Result Value Ref Range   SARS Coronavirus 2 NEGATIVE NEGATIVE    Comment: (NOTE) SARS-CoV-2 target nucleic acids are NOT DETECTED.  The SARS-CoV-2 RNA is generally detectable in upper and lower respiratory specimens during the acute phase of infection. The lowest concentration of SARS-CoV-2 viral copies this assay can detect is 250 copies / mL. A negative result does not preclude SARS-CoV-2 infection and should not be used as the sole basis for treatment or other patient management decisions.  A negative result may occur with improper specimen collection / handling, submission of specimen other than nasopharyngeal swab, presence of viral mutation(s) within the areas targeted by this assay, and inadequate number of viral copies (<250 copies / mL). A negative result must be combined with clinical observations, patient history, and epidemiological information.  Fact Sheet for Patients:    StrictlyIdeas.no  Fact Sheet for Healthcare Providers: BankingDealers.co.za  This test is not yet approved or  cleared by the [place] FDA and has been authorized for detection and/or diagnosis of SARS-CoV-2 by FDA under an Emergency Use Authorization (EUA).  This EUA will remain in effect (meaning this test can be used) for the duration of the COVID-19 declaration under Section 564(b)(1)  of the Act, 21 U.S.C. section 360bbb-3(b)(1), unless the authorization is terminated or revoked sooner.  Performed at [facility], [address] [place], [place] [address]    DG Chest 1 View  Result Date: 03/11/2020 CLINICAL DATA:  Fall, left hip fracture EXAM: CHEST  1 VIEW COMPARISON:  02/13/2020 FINDINGS: Stable heart size and vascularity. No significant focal pneumonia, collapse or consolidation. Negative for edema, effusion or pneumothorax. Aorta atherosclerotic and tortuous. Old left proximal humerus surgical neck fracture with foreshortening and impaction. IMPRESSION: No active chest disease Old left proximal humerus surgical neck fracture Aortic Atherosclerosis (ICD10-I70.0). Electronically Signed   By: Jerilynn Mages.  Shick M.D.   On: 03/11/2020 09:18   DG Pelvis 1-2 Views  Result Date: 03/11/2020 CLINICAL DATA:  Fall, left hip pain EXAM: PELVIS - 1-2 VIEW COMPARISON:  03/11/2020 FINDINGS: There is an acute displaced left hip subcapital femoral neck fracture. Bony pelvis and right hip appear intact. Bones are osteopenic. Aortoiliac atherosclerosis evident. Nonobstructive bowel gas pattern. IMPRESSION: Acute left hip subcapital femoral neck fracture. Osteopenia Aortic Atherosclerosis (ICD10-I70.0). Electronically Signed   By: Jerilynn Mages.  Shick M.D.   On: 03/11/2020 09:19   DG FEMUR MIN 2 VIEWS LEFT  Result Date: 03/11/2020 CLINICAL DATA:  Fall, left hip fracture EXAM: LEFT FEMUR 2 VIEWS COMPARISON:  03/11/2020 FINDINGS: There is an acute left hip subcapital  femoral neck fracture with displacement and slight foreshortening. Bones are osteopenic. Distal femur intact at the knee. Femoral and popliteal peripheral atherosclerosis evident. No associated subluxation or dislocation. IMPRESSION: Acute left hip subcapital femoral neck fracture. Osteopenia Electronically Signed   By: Jerilynn Mages.  Shick M.D.   On: 03/11/2020 09:21    Pending Labs Unresulted Labs (From admission, onward) Comment          Start     Ordered   03/11/20 0744  Urinalysis, Routine w reflex microscopic  Once,   STAT        03/11/20 0743          Vitals/Pain Today's Vitals   03/11/20 1307 03/11/20 1330 03/11/20 1400 03/11/20 1430  BP:  123/63 115/62 (!) 111/58  Pulse:  89 85 90  Resp:  (!) 23 19 20   Temp:      SpO2:  98% 95% 93%  Weight:      Height:      PainSc: 0-No pain       Isolation Precautions No active isolations  Medications Medications  fentaNYL (SUBLIMAZE) injection 30 mcg (30 mcg Intravenous Given 03/11/20 1143)  0.9 %  sodium chloride infusion ( Intravenous New Bag/Given 03/11/20 0814)  oxyCODONE (Oxy IR/ROXICODONE) immediate release tablet 5 mg (has no administration in time range)  methocarbamol (ROBAXIN) tablet 500 mg (500 mg Oral Given 03/11/20 1433)  fentaNYL (SUBLIMAZE) injection 12.5-25 mcg (has no administration in time range)  heparin injection 5,000 Units (5,000 Units Subcutaneous Given 03/11/20 1311)  fentaNYL (SUBLIMAZE) injection 30 mcg (30 mcg Intravenous Given 03/11/20 0948)    Mobility walks Low fall risk   Focused Assessments    R Recommendations: See Admitting Provider Note  Report given to:   Additional Notes:

## 2020-03-11 NOTE — H&P (View-Only) (Signed)
Reason for Consult:fracture left hip  Referring Physician: DR Roxan Hockey  Joyce Hogan is an 86 y.o. female.  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 antibiotics at home presents with new left hip fracture from mechanical fall.  Patient was feeling really good lately and got up without her walker and fell going to the bathroom on the morning of March 11, 2020.  Complains of severe nonradiating deep dull aching left hip pain.  Patient was on no medications prior to these recent illnesses.  Patient noted to recently have decreasing hemoglobins diagnosis and cause unknown.    History reviewed. No pertinent past medical history.  Past Surgical History:  Procedure Laterality Date  . ABDOMINAL HYSTERECTOMY    . CATARACT EXTRACTION W/PHACO Right 01/04/2016   Procedure: CATARACT EXTRACTION PHACO AND INTRAOCULAR LENS PLACEMENT (IOC);  Surgeon: Tonny Branch, MD;  Location: [facility];  Service: Ophthalmology;  Laterality: Right;  CDE 9.06  . CATARACT EXTRACTION W/PHACO Left 01/21/2016   Procedure: CATARACT EXTRACTION PHACO AND INTRAOCULAR LENS PLACEMENT (IOC);  Surgeon: Tonny Branch, MD;  Location: [facility];  Service: Ophthalmology;  Laterality: Left;  CDE: 12.65  . TONSILLECTOMY      Family History  Problem Relation Age of Onset  . Dementia Mother     Social History:  reports that she has never smoked. She has never used smokeless tobacco. She reports that she does not drink alcohol and does not use drugs.  Allergies:  Allergies  Allergen Reactions  . Penicillins     unknown    Medications: I have reviewed the patient's current medications.  Results for orders placed or performed during the hospital encounter of 03/11/20 (from the past 48 hour(s))  Basic metabolic panel     Status: Abnormal   Collection Time:  03/11/20  7:26 AM  Result Value Ref Range   Sodium 137 135 - 145 mmol/L   Potassium 3.9 3.5 - 5.1 mmol/L   Chloride 100 98 - 111 mmol/L   CO2 26 22 - 32 mmol/L   Glucose, Bld 116 (H) 70 - 99 mg/dL    Comment: Glucose reference range applies only to samples taken after fasting for at least 8 hours.   BUN 18 8 - 23 mg/dL   Creatinine, Ser 0.71 0.44 - 1.00 mg/dL   Calcium 8.8 (L) 8.9 - 10.3 mg/dL   GFR calc non Af Amer >60 >60 mL/min   GFR calc Af Amer >60 >60 mL/min   Anion gap 11 5 - 15    Comment: Performed at [facility], [address] [place],  [address]  CBC WITH DIFFERENTIAL     Status: Abnormal   Collection Time: 03/11/20  7:26 AM  Result Value Ref Range   WBC 6.2 4.0 - 10.5 K/uL   RBC 3.32 (L) 3.87 - 5.11 MIL/uL   Hemoglobin 9.6 (L) 12.0 - 15.0 g/dL   HCT 30.5 (L) 36 - 46 %   MCV 91.9 80.0 - 100.0 fL   MCH 28.9 26.0 - 34.0 pg   MCHC 31.5 30.0 - 36.0 g/dL   RDW 14.1 11.5 - 15.5 %   Platelets 293 150 - 400 K/uL   nRBC 0.0 0.0 - 0.2 %   Neutrophils Relative % 56 %   Neutro Abs 3.4 1.7 - 7.7 K/uL   Lymphocytes Relative 27 %   Lymphs Abs  1.7 0.7 - 4.0 K/uL   Monocytes Relative 12 %   Monocytes Absolute 0.8 0 - 1 K/uL   Eosinophils Relative 3 %   Eosinophils Absolute 0.2 0 - 0 K/uL   Basophils Relative 0 %   Basophils Absolute 0.0 0 - 0 K/uL   Immature Granulocytes 2 %   Abs Immature Granulocytes 0.12 (H) 0.00 - 0.07 K/uL    Comment: Performed at [facility], [address] [place], [place] [address]  Protime-INR     Status: None   Collection Time: 03/11/20  7:26 AM  Result Value Ref Range   Prothrombin Time 13.8 11.4 - 15.2 seconds   INR 1.1 0.8 - 1.2    Comment: (NOTE) INR goal varies based on device and disease states. Performed at [facility], [address] [place], [place] [address]   Type and screen [facility]     Status: None   Collection Time: 03/11/20  7:34 AM  Result Value Ref Range   ABO/RH(D) A POS    Antibody Screen NEG     Sample Expiration      03/14/2020,2359 Performed at [facility], [address] [place], [place] [address]   ABO/Rh     Status: None   Collection Time: 03/11/20  7:34 AM  Result Value Ref Range   ABO/RH(D)      A POS Performed at [facility], [address] [place], [place] [address]   SARS Coronavirus 2 by RT PCR (hospital order, performed in [facility] hospital lab) Nasopharyngeal Nasopharyngeal Swab     Status: None   Collection Time: 03/11/20  8:20 AM   Specimen: Nasopharyngeal Swab  Result Value Ref Range   SARS Coronavirus 2 NEGATIVE NEGATIVE    Comment: (NOTE) SARS-CoV-2 target nucleic acids are NOT DETECTED.  The SARS-CoV-2 RNA is generally detectable in upper and lower respiratory specimens during the acute phase of infection. The lowest concentration of SARS-CoV-2 viral copies this assay can detect is 250 copies / mL. A negative result does not preclude SARS-CoV-2 infection and should not be used as the sole basis for treatment or other patient management decisions.  A negative result may occur with improper specimen collection / handling, submission of specimen other than nasopharyngeal swab, presence of viral mutation(s) within the areas targeted by this assay, and inadequate number of viral copies (<250 copies / mL). A negative result must be combined with clinical observations, patient history, and epidemiological information.  Fact Sheet for Patients:   StrictlyIdeas.no  Fact Sheet for Healthcare Providers: BankingDealers.co.za  This test is not yet approved or  cleared by the [place] FDA and has been authorized for detection and/or diagnosis of SARS-CoV-2 by FDA under an Emergency Use Authorization (EUA).  This EUA will remain in effect (meaning this test can be used) for the duration of the COVID-19 declaration under Section 564(b)(1) of the Act, 21 U.S.C. section 360bbb-3(b)(1), unless the authorization  is terminated or revoked sooner.  Performed at [facility], [address] [place], [place] [address]     DG Chest 1 View  Result Date: 03/11/2020 CLINICAL DATA:  Fall, left hip fracture EXAM: CHEST  1 VIEW COMPARISON:  02/13/2020 FINDINGS: Stable heart size and vascularity. No significant focal pneumonia, collapse or consolidation. Negative for edema, effusion or pneumothorax. Aorta atherosclerotic and tortuous. Old left proximal humerus surgical neck fracture with foreshortening and impaction. IMPRESSION: No active chest disease Old left proximal humerus surgical neck fracture Aortic Atherosclerosis (ICD10-I70.0). Electronically Signed  By: Eugenie Filler M.D.   On: 03/11/2020 09:18   DG Pelvis 1-2 Views  Result Date: 03/11/2020 CLINICAL DATA:  Fall, left hip pain EXAM: PELVIS - 1-2 VIEW COMPARISON:  03/11/2020 FINDINGS: There is an acute displaced left hip subcapital femoral neck fracture. Bony pelvis and right hip appear intact. Bones are osteopenic. Aortoiliac atherosclerosis evident. Nonobstructive bowel gas pattern. IMPRESSION: Acute left hip subcapital femoral neck fracture. Osteopenia Aortic Atherosclerosis (ICD10-I70.0). Electronically Signed   By: Jerilynn Mages.  Shick M.D.   On: 03/11/2020 09:19   DG FEMUR MIN 2 VIEWS LEFT  Result Date: 03/11/2020 CLINICAL DATA:  Fall, left hip fracture EXAM: LEFT FEMUR 2 VIEWS COMPARISON:  03/11/2020 FINDINGS: There is an acute left hip subcapital femoral neck fracture with displacement and slight foreshortening. Bones are osteopenic. Distal femur intact at the knee. Femoral and popliteal peripheral atherosclerosis evident. No associated subluxation or dislocation. IMPRESSION: Acute left hip subcapital femoral neck fracture. Osteopenia Electronically Signed   By: Jerilynn Mages.  Shick M.D.   On: 03/11/2020 09:21    Review of Systems  Genitourinary:       Recent UTI  Musculoskeletal:       Residual swelling decreased range of motion right arm from proximal humerus fracture   All other systems reviewed and are negative.  acuity, pain age  Blood pressure (!) 111/58, pulse 90, temperature 97.7 F (36.5 C), resp. rate 20, height 5\' 1"  (1.549 m), weight 53.1 kg, SpO2 93 %. Physical Exam Gen normal  Ms aao x 2 of 3  Mood affect normal  Gait: unable to walk   Neuro normal  Vascular normal perfusion color capillary refill but we do note some skin discoloration probably from venous stasis disease early onset and age-related Coordination could not assess  Right arm left arm skin normal alignment normal, rom within normal range stable joints, motor normal   Right leg skin normal, aligned normally, rom within normal, joints stable, motor normal   Left leg: skin normal, rom limited by pain, external rotation and short , tender prox thigh, stable knee, motor no atrophy.  Assessment/Plan:  Hemiarthroplasty partial hip replacement left hip for left hip femoral neck fracture complete displacement   problem #1 left hip fracture, requires partial hip replacement  Problem #2 hemoglobin is 9.6 potential for postoperative anemia will need type and crossmatch x2 per new protocols  The procedure has been fully reviewed with the patient; The risks and benefits of surgery have been discussed and explained and understood. Alternative treatment has also been reviewed, questions were encouraged and answered. The postoperative plan is also been reviewed.  Arther Abbott 03/11/2020, 2:51 PM

## 2020-03-11 NOTE — ED Triage Notes (Signed)
[facility] this am going to [facility],  Pt says she did not have her walker.  Injury to left hip.  Pt has external rotation and shorting of left leg. Rates pain 5/10.

## 2020-03-11 NOTE — Progress Notes (Signed)
   Subjective:    Patient ID: Joyce Hogan, female    DOB: 08-11-1927, 86 y.o.   MRN: [id number]  Left hip fracture   Repair thurs  Stop anticoags at midnight      Review of Systems     Objective:   Physical Exam        Assessment & Plan:

## 2020-03-11 NOTE — ED Provider Notes (Signed)
[facility] EMERGENCY DEPARTMENT Provider Note   CSN: [id number] Arrival date & time: 03/11/20  7026     History Chief Complaint  Patient presents with  . Fall    Joyce Hogan is a 86 y.o. female.  Patient with no significant medical history, normally uses a walker, lives alone with assistance presents with left hip pain after she fell going from bedroom to bathroom.  Patient did not have her walker on her and slipped.  Patient denies head or other injuries.  Pain constant in the left hip.  Patient not on blood thinners.        History reviewed. No pertinent past medical history.  Patient Active Problem List   Diagnosis Date Noted  . Hip fracture ([place]) 03/11/2020  . Fall at home, initial encounter 03/11/2020  . Closed fracture of left proximal humerus 12/04/2019    Past Surgical History:  Procedure Laterality Date  . ABDOMINAL HYSTERECTOMY    . CATARACT EXTRACTION W/PHACO Right 01/04/2016   Procedure: CATARACT EXTRACTION PHACO AND INTRAOCULAR LENS PLACEMENT (IOC);  Surgeon: Tonny Branch, MD;  Location: [facility];  Service: Ophthalmology;  Laterality: Right;  CDE 9.06  . CATARACT EXTRACTION W/PHACO Left 01/21/2016   Procedure: CATARACT EXTRACTION PHACO AND INTRAOCULAR LENS PLACEMENT (IOC);  Surgeon: Tonny Branch, MD;  Location: [facility];  Service: Ophthalmology;  Laterality: Left;  CDE: 12.65  . TONSILLECTOMY       OB History   No obstetric history on file.     Family History  Problem Relation Age of Onset  . Dementia Mother     Social History   Tobacco Use  . Smoking status: Never Smoker  . Smokeless tobacco: Never Used  Substance Use Topics  . Alcohol use: No  . Drug use: No    Home Medications Prior to Admission medications   Medication Sig Start Date End Date Taking? Authorizing Provider  acetaminophen (TYLENOL) 325 MG tablet Take 650 mg by mouth every 6 (six) hours as needed for mild pain or moderate pain. ALL PILLS ARE CRUSHED-MIXED IN APPLESAUCE   Yes  [provider]  Calcium-Vitamin D-Vitamin K (VIACTIV CALCIUM PLUS D) 650-12.5-40 MG-MCG-MCG CHEW Chew 1 each by mouth in the morning and at bedtime.   Yes [provider]  diltiazem (CARDIZEM) 30 MG tablet Take 30 mg by mouth daily. ALL PILLS ARE CRUSHED-MIXED IN APPLESAUCE 02/20/20  Yes [provider]  Multiple Vitamins-Minerals (PRESERVISION AREDS 2) CHEW Chew 1 tablet by mouth in the morning and at bedtime.   Yes [provider]  potassium chloride 20 MEQ/15ML (10%) SOLN Take 20 mEq by mouth daily. 02/14/20  Yes [provider]  ciprofloxacin (CIPRO) 500 MG tablet Take 1 tablet (500 mg total) by mouth 2 (two) times daily. One po bid x 7 days Patient not taking: Reported on 03/11/2020 03/01/20   Milton Ferguson, MD    Allergies    Penicillins  Review of Systems   Review of Systems  Constitutional: Negative for chills and fever.  HENT: Negative for congestion.   Eyes: Negative for visual disturbance.  Respiratory: Negative for shortness of breath.   Cardiovascular: Negative for chest pain.  Gastrointestinal: Negative for abdominal pain and vomiting.  Genitourinary: Negative for dysuria and flank pain.  Musculoskeletal: Positive for gait problem and joint swelling. Negative for back pain, neck pain and neck stiffness.  Skin: Negative for rash.  Neurological: Negative for light-headedness and headaches.    Physical Exam Updated Vital Signs BP 122/67 (BP Location:  Right Arm)   Pulse 86   Temp 98.2 F (36.8 C) (Oral)   Resp 18   Ht 5\' 1"  (1.549 m)   Wt 50.6 kg   SpO2 96%   BMI 21.08 kg/m   Physical Exam Vitals and nursing note reviewed.  Constitutional:      Appearance: She is well-developed.  HENT:     Head: Normocephalic and atraumatic.  Eyes:     General:        Right eye: No discharge.        Left eye: No discharge.     Conjunctiva/sclera: Conjunctivae normal.  Neck:     Trachea: No tracheal deviation.  Cardiovascular:      Rate and Rhythm: Normal rate and regular rhythm.  Pulmonary:     Effort: Pulmonary effort is normal.     Breath sounds: Normal breath sounds.  Abdominal:     General: There is no distension.     Palpations: Abdomen is soft.     Tenderness: There is no abdominal tenderness. There is no guarding.  Musculoskeletal:        General: Tenderness and signs of injury present. No swelling or deformity.     Cervical back: Normal range of motion and neck supple.     Comments: Patient has no midline cervical thoracic or lumbar tenderness.  Patient has tenderness moderate with flexion or rotation of the left hip or proximal femur.  No tenderness to right extremity lower, no tenderness to left knee or ankle or lower tibia.  Patient's left leg is shortened.  Skin:    General: Skin is warm.     Findings: No rash.  Neurological:     General: No focal deficit present.     Mental Status: She is alert and oriented to person, place, and time.     Cranial Nerves: No cranial nerve deficit.  Psychiatric:        Mood and Affect: Mood normal.     ED Results / Procedures / Treatments   Labs (all labs ordered are listed, but only abnormal results are displayed) Labs Reviewed  BASIC METABOLIC PANEL - Abnormal; Notable for the following components:      Result Value   Glucose, Bld 116 (*)    Calcium 8.8 (*)    All other components within normal limits  CBC WITH DIFFERENTIAL/PLATELET - Abnormal; Notable for the following components:   RBC 3.32 (*)    Hemoglobin 9.6 (*)    HCT 30.5 (*)    Abs Immature Granulocytes 0.12 (*)    All other components within normal limits  SARS CORONAVIRUS 2 BY RT PCR (HOSPITAL ORDER, [facility])  PROTIME-INR  URINALYSIS, ROUTINE W REFLEX MICROSCOPIC  TYPE AND SCREEN  ABO/RH    EKG EKG Interpretation  Date/Time:  Wednesday March 11 2020 07:22:58 EDT Ventricular Rate:  90 PR Interval:    QRS Duration: 119 QT Interval:  405 QTC  Calculation: 496 R Axis:   -60 Text Interpretation: Sinus rhythm Atrial premature complexes in couplets Incomplete RBBB and LAFB Confirmed by Elnora Morrison [phone]) on 03/11/2020 8:11:00 AM   Radiology DG Chest 1 View  Result Date: 03/11/2020 CLINICAL DATA:  Fall, left hip fracture EXAM: CHEST  1 VIEW COMPARISON:  02/13/2020 FINDINGS: Stable heart size and vascularity. No significant focal pneumonia, collapse or consolidation. Negative for edema, effusion or pneumothorax. Aorta atherosclerotic and tortuous. Old left proximal humerus surgical neck fracture with foreshortening and impaction. IMPRESSION: No active  chest disease Old left proximal humerus surgical neck fracture Aortic Atherosclerosis (ICD10-I70.0). Electronically Signed   By: Jerilynn Mages.  Shick M.D.   On: 03/11/2020 09:18   DG Pelvis 1-2 Views  Result Date: 03/11/2020 CLINICAL DATA:  Fall, left hip pain EXAM: PELVIS - 1-2 VIEW COMPARISON:  03/11/2020 FINDINGS: There is an acute displaced left hip subcapital femoral neck fracture. Bony pelvis and right hip appear intact. Bones are osteopenic. Aortoiliac atherosclerosis evident. Nonobstructive bowel gas pattern. IMPRESSION: Acute left hip subcapital femoral neck fracture. Osteopenia Aortic Atherosclerosis (ICD10-I70.0). Electronically Signed   By: Jerilynn Mages.  Shick M.D.   On: 03/11/2020 09:19   DG FEMUR MIN 2 VIEWS LEFT  Result Date: 03/11/2020 CLINICAL DATA:  Fall, left hip fracture EXAM: LEFT FEMUR 2 VIEWS COMPARISON:  03/11/2020 FINDINGS: There is an acute left hip subcapital femoral neck fracture with displacement and slight foreshortening. Bones are osteopenic. Distal femur intact at the knee. Femoral and popliteal peripheral atherosclerosis evident. No associated subluxation or dislocation. IMPRESSION: Acute left hip subcapital femoral neck fracture. Osteopenia Electronically Signed   By: Jerilynn Mages.  Shick M.D.   On: 03/11/2020 09:21    Procedures Procedures (including critical care time)  Medications  Ordered in ED Medications  fentaNYL (SUBLIMAZE) injection 30 mcg (30 mcg Intravenous Given 03/11/20 1143)  0.9 %  sodium chloride infusion ( Intravenous New Bag/Given 03/11/20 0814)  oxyCODONE (Oxy IR/ROXICODONE) immediate release tablet 5 mg (has no administration in time range)  methocarbamol (ROBAXIN) tablet 500 mg (500 mg Oral Given 03/11/20 1433)  fentaNYL (SUBLIMAZE) injection 12.5-25 mcg (has no administration in time range)  heparin injection 5,000 Units (5,000 Units Subcutaneous Given 03/11/20 1311)  fentaNYL (SUBLIMAZE) injection 30 mcg (30 mcg Intravenous Given 03/11/20 0948)    ED Course  I have reviewed the triage vital signs and the nursing notes.  Pertinent labs & imaging results that were available during my care of the patient were reviewed by me and considered in my medical decision making (see chart for details).    MDM Rules/Calculators/A&P                          Patient presents with mechanical fall likely from not using her walker and clinical concern for left hip fracture.  Blood work ordered, type and screen, chest x-ray, left femur and pelvis x-ray pending.  Pain meds ordered as needed.  X-ray reviewed showing left femoral neck hip fracture.  Repeat pain meds given.  Blood work reviewed hemoglobin 9.6, normal white blood cell count.  Urinalysis pending.  Discussed with orthopedics and hospitalist for admission.  Final Clinical Impression(s) / ED Diagnoses Final diagnoses:  Fall, initial encounter  Closed left hip fracture, initial encounter [facility])    Rx / DC Orders ED Discharge Orders    None       Elnora Morrison, MD 03/11/20 1615

## 2020-03-11 NOTE — Consult Note (Addendum)
Reason for Consult:fracture left hip  Referring Physician: DR Roxan Hockey  Joyce Hogan is an 86 y.o. female.  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 antibiotics at home presents with new left hip fracture from mechanical fall.  Patient was feeling really good lately and got up without her walker and fell going to the bathroom on the morning of March 11, 2020.  Complains of severe nonradiating deep dull aching left hip pain.  Patient was on no medications prior to these recent illnesses.  Patient noted to recently have decreasing hemoglobins diagnosis and cause unknown.    History reviewed. No pertinent past medical history.  Past Surgical History:  Procedure Laterality Date  . ABDOMINAL HYSTERECTOMY    . CATARACT EXTRACTION W/PHACO Right 01/04/2016   Procedure: CATARACT EXTRACTION PHACO AND INTRAOCULAR LENS PLACEMENT (IOC);  Surgeon: Tonny Branch, MD;  Location: [facility];  Service: Ophthalmology;  Laterality: Right;  CDE 9.06  . CATARACT EXTRACTION W/PHACO Left 01/21/2016   Procedure: CATARACT EXTRACTION PHACO AND INTRAOCULAR LENS PLACEMENT (IOC);  Surgeon: Tonny Branch, MD;  Location: [facility];  Service: Ophthalmology;  Laterality: Left;  CDE: 12.65  . TONSILLECTOMY      Family History  Problem Relation Age of Onset  . Dementia Mother     Social History:  reports that she has never smoked. She has never used smokeless tobacco. She reports that she does not drink alcohol and does not use drugs.  Allergies:  Allergies  Allergen Reactions  . Penicillins     unknown    Medications: I have reviewed the patient's current medications.  Results for orders placed or performed during the hospital encounter of 03/11/20 (from the past 48 hour(s))  Basic metabolic panel     Status: Abnormal   Collection Time:  03/11/20  7:26 AM  Result Value Ref Range   Sodium 137 135 - 145 mmol/L   Potassium 3.9 3.5 - 5.1 mmol/L   Chloride 100 98 - 111 mmol/L   CO2 26 22 - 32 mmol/L   Glucose, Bld 116 (H) 70 - 99 mg/dL    Comment: Glucose reference range applies only to samples taken after fasting for at least 8 hours.   BUN 18 8 - 23 mg/dL   Creatinine, Ser 0.71 0.44 - 1.00 mg/dL   Calcium 8.8 (L) 8.9 - 10.3 mg/dL   GFR calc non Af Amer >60 >60 mL/min   GFR calc Af Amer >60 >60 mL/min   Anion gap 11 5 - 15    Comment: Performed at [facility], [address], [place], [place] [address]  CBC WITH DIFFERENTIAL     Status: Abnormal   Collection Time: 03/11/20  7:26 AM  Result Value Ref Range   WBC 6.2 4.0 - 10.5 K/uL   RBC 3.32 (L) 3.87 - 5.11 MIL/uL   Hemoglobin 9.6 (L) 12.0 - 15.0 g/dL   HCT 30.5 (L) 36 - 46 %   MCV 91.9 80.0 - 100.0 fL   MCH 28.9 26.0 - 34.0 pg   MCHC 31.5 30.0 - 36.0 g/dL   RDW 14.1 11.5 - 15.5 %   Platelets 293 150 - 400 K/uL   nRBC 0.0 0.0 - 0.2 %   Neutrophils Relative % 56 %   Neutro Abs 3.4 1.7 - 7.7 K/uL   Lymphocytes Relative 27 %   Lymphs Abs  1.7 0.7 - 4.0 K/uL   Monocytes Relative 12 %   Monocytes Absolute 0.8 0 - 1 K/uL   Eosinophils Relative 3 %   Eosinophils Absolute 0.2 0 - 0 K/uL   Basophils Relative 0 %   Basophils Absolute 0.0 0 - 0 K/uL   Immature Granulocytes 2 %   Abs Immature Granulocytes 0.12 (H) 0.00 - 0.07 K/uL    Comment: Performed at [facility], [address], [place], [place] [address]  Protime-INR     Status: None   Collection Time: 03/11/20  7:26 AM  Result Value Ref Range   Prothrombin Time 13.8 11.4 - 15.2 seconds   INR 1.1 0.8 - 1.2    Comment: (NOTE) INR goal varies based on device and disease states. Performed at [facility], [address], [place], [place] [address]   Type and screen [facility]     Status: None   Collection Time: 03/11/20  7:34 AM  Result Value Ref Range   ABO/RH(D) A POS    Antibody Screen NEG     Sample Expiration      03/14/2020,2359 Performed at [facility], [address], [place], [place] [address]   ABO/Rh     Status: None   Collection Time: 03/11/20  7:34 AM  Result Value Ref Range   ABO/RH(D)      A POS Performed at [facility], [address], [place], [place] [address]   SARS Coronavirus 2 by RT PCR (hospital order, performed in [facility] hospital lab) Nasopharyngeal Nasopharyngeal Swab     Status: None   Collection Time: 03/11/20  8:20 AM   Specimen: Nasopharyngeal Swab  Result Value Ref Range   SARS Coronavirus 2 NEGATIVE NEGATIVE    Comment: (NOTE) SARS-CoV-2 target nucleic acids are NOT DETECTED.  The SARS-CoV-2 RNA is generally detectable in upper and lower respiratory specimens during the acute phase of infection. The lowest concentration of SARS-CoV-2 viral copies this assay can detect is 250 copies / mL. A negative result does not preclude SARS-CoV-2 infection and should not be used as the sole basis for treatment or other patient management decisions.  A negative result may occur with improper specimen collection / handling, submission of specimen other than nasopharyngeal swab, presence of viral mutation(s) within the areas targeted by this assay, and inadequate number of viral copies (<250 copies / mL). A negative result must be combined with clinical observations, patient history, and epidemiological information.  Fact Sheet for Patients:   StrictlyIdeas.no  Fact Sheet for Healthcare Providers: BankingDealers.co.za  This test is not yet approved or  cleared by the [place] FDA and has been authorized for detection and/or diagnosis of SARS-CoV-2 by FDA under an Emergency Use Authorization (EUA).  This EUA will remain in effect (meaning this test can be used) for the duration of the COVID-19 declaration under Section 564(b)(1) of the Act, 21 U.S.C. section 360bbb-3(b)(1), unless the authorization  is terminated or revoked sooner.  Performed at [facility], [address] [place], [place] [address]     DG Chest 1 View  Result Date: 03/11/2020 CLINICAL DATA:  Fall, left hip fracture EXAM: CHEST  1 VIEW COMPARISON:  02/13/2020 FINDINGS: Stable heart size and vascularity. No significant focal pneumonia, collapse or consolidation. Negative for edema, effusion or pneumothorax. Aorta atherosclerotic and tortuous. Old left proximal humerus surgical neck fracture with foreshortening and impaction. IMPRESSION: No active chest disease Old left proximal humerus surgical neck fracture Aortic Atherosclerosis (ICD10-I70.0). Electronically Signed  By: Eugenie Filler M.D.   On: 03/11/2020 09:18   DG Pelvis 1-2 Views  Result Date: 03/11/2020 CLINICAL DATA:  Fall, left hip pain EXAM: PELVIS - 1-2 VIEW COMPARISON:  03/11/2020 FINDINGS: There is an acute displaced left hip subcapital femoral neck fracture. Bony pelvis and right hip appear intact. Bones are osteopenic. Aortoiliac atherosclerosis evident. Nonobstructive bowel gas pattern. IMPRESSION: Acute left hip subcapital femoral neck fracture. Osteopenia Aortic Atherosclerosis (ICD10-I70.0). Electronically Signed   By: Jerilynn Mages.  Shick M.D.   On: 03/11/2020 09:19   DG FEMUR MIN 2 VIEWS LEFT  Result Date: 03/11/2020 CLINICAL DATA:  Fall, left hip fracture EXAM: LEFT FEMUR 2 VIEWS COMPARISON:  03/11/2020 FINDINGS: There is an acute left hip subcapital femoral neck fracture with displacement and slight foreshortening. Bones are osteopenic. Distal femur intact at the knee. Femoral and popliteal peripheral atherosclerosis evident. No associated subluxation or dislocation. IMPRESSION: Acute left hip subcapital femoral neck fracture. Osteopenia Electronically Signed   By: Jerilynn Mages.  Shick M.D.   On: 03/11/2020 09:21    Review of Systems  Genitourinary:       Recent UTI  Musculoskeletal:       Residual swelling decreased range of motion right arm from proximal humerus fracture   All other systems reviewed and are negative.  acuity, pain age  Blood pressure (!) 111/58, pulse 90, temperature 97.7 F (36.5 C), resp. rate 20, height 5\' 1"  (1.549 m), weight 53.1 kg, SpO2 93 %. Physical Exam Gen normal  Ms aao x 2 of 3  Mood affect normal  Gait: unable to walk   Neuro normal  Vascular normal perfusion color capillary refill but we do note some skin discoloration probably from venous stasis disease early onset and age-related Coordination could not assess  Right arm left arm skin normal alignment normal, rom within normal range stable joints, motor normal   Right leg skin normal, aligned normally, rom within normal, joints stable, motor normal   Left leg: skin normal, rom limited by pain, external rotation and short , tender prox thigh, stable knee, motor no atrophy.  Assessment/Plan:  Hemiarthroplasty partial hip replacement left hip for left hip femoral neck fracture complete displacement   problem #1 left hip fracture, requires partial hip replacement  Problem #2 hemoglobin is 9.6 potential for postoperative anemia will need type and crossmatch x2 per new protocols  The procedure has been fully reviewed with the patient; The risks and benefits of surgery have been discussed and explained and understood. Alternative treatment has also been reviewed, questions were encouraged and answered. The postoperative plan is also been reviewed.  Arther Abbott 03/11/2020, 2:51 PM

## 2020-03-11 NOTE — ED Triage Notes (Signed)
Pt says she fail to have her walker and slipped on hard wood floor.

## 2020-03-11 NOTE — H&P (Signed)
Patient Demographics:    Joyce Hogan, is a 86 y.o. female  MRN: [id number]   DOB - 1927-06-08  Admit Date - 03/11/2020  Outpatient Primary MD for the patient is Neale Burly, MD   Assessment & Plan:    Principal Problem:   Hip fracture [facility]) Active Problems:   Fall at home, initial encounter    1) left hip fracture--- status post mechanical fall, orthopedic consult requested plans for operative treatment on 03/12/2020 -Further management and DVT prophylaxis as per orthopedic surgeon -As needed fentanyl or oxycodone as needed for pain  2)-history of paroxysmal atrial fibrillation--give metoprolol 12.5 mg twice daily for rate control, -PTA was not on anticoagulation, -Echo and TSH requested - 3) chronic normocytic and normochromic anemia--- etiology unclear at this time, hemoglobin 9.6 which is around patient's prior baseline -No evidence of ongoing bleeding at this time  4) recent left humerus fracture--- apparently healing nicely  Disposition/Need for in-Hospital Stay- patient unable to be discharged at this time due to --- left hip fracture requiring operative treatment  Status is: Inpatient  Remains inpatient appropriate because:Inpatient level of care appropriate due to severity of illness   Dispo: The patient is from: Home              Anticipated d/c is to: SNF              Anticipated d/c date is: 2 days              Patient currently is not medically stable to d/c. Barriers: Not Clinically Stable- --left hip fracture needs surgical/operative intervention   With History of - Reviewed by me  History reviewed. No pertinent past medical history.    Past Surgical History:  Procedure Laterality Date   ABDOMINAL HYSTERECTOMY     CATARACT EXTRACTION W/PHACO Right 01/04/2016   Procedure:  CATARACT EXTRACTION PHACO AND INTRAOCULAR LENS PLACEMENT (IOC);  Surgeon: Tonny Branch, MD;  Location: [facility];  Service: Ophthalmology;  Laterality: Right;  CDE 9.06   CATARACT EXTRACTION W/PHACO Left 01/21/2016   Procedure: CATARACT EXTRACTION PHACO AND INTRAOCULAR LENS PLACEMENT (IOC);  Surgeon: Tonny Branch, MD;  Location: [facility];  Service: Ophthalmology;  Laterality: Left;  CDE: 12.65   TONSILLECTOMY        Chief Complaint  Patient presents with   Fall      HPI:    Joyce Hogan  is a 86 y.o. female with history of paroxysmal atrial fibrillation and osteoarthritis presents from home after mechanical fall landing on the left side with left hip fracture -No syncope, no chest pain no palpitation no dizziness -Patient apparently was ambulating at home without her walker and had a mechanical fall - -Left hip and pelvic x-rays consistent with left hip fracture -Chest x-ray without acute findings -CBC demonstrates chronic anemia which is at patient's baseline -BMP without acute abnormalities -Additional history obtained from patient's daughter at bedside -Apparently she is not on  anticoagulation for a paroxysmal A. fib, she takes Cardizem every now and then for rate control  No Nausea, Vomiting or Diarrhea  No fever  Or chills No chest pains palpitations or dizziness  --Additional history obtained from patient's daughter at bedside    Review of systems:    In addition to the HPI above,   A full Review of  Systems was done, all other systems reviewed are negative except as noted above in HPI , .    Social History:  Reviewed by me    Social History   Tobacco Use   Smoking status: Never Smoker   Smokeless tobacco: Never Used  Substance Use Topics   Alcohol use: No       Family History :  Reviewed by me    Family History  Problem Relation Age of Onset   Dementia Mother      Home Medications:   Prior to Admission medications   Medication Sig Start Date End  Date Taking? Authorizing Provider  acetaminophen (TYLENOL) 325 MG tablet Take 650 mg by mouth every 6 (six) hours as needed for mild pain or moderate pain. ALL PILLS ARE CRUSHED-MIXED IN APPLESAUCE   Yes [provider]  Calcium-Vitamin D-Vitamin K (VIACTIV CALCIUM PLUS D) 650-12.5-40 MG-MCG-MCG CHEW Chew 1 each by mouth in the morning and at bedtime.   Yes [provider]  diltiazem (CARDIZEM) 30 MG tablet Take 30 mg by mouth daily. ALL PILLS ARE CRUSHED-MIXED IN APPLESAUCE 02/20/20  Yes [provider]  Multiple Vitamins-Minerals (PRESERVISION AREDS 2) CHEW Chew 1 tablet by mouth in the morning and at bedtime.   Yes [provider]  potassium chloride 20 MEQ/15ML (10%) SOLN Take 20 mEq by mouth daily. 02/14/20  Yes [provider]  ciprofloxacin (CIPRO) 500 MG tablet Take 1 tablet (500 mg total) by mouth 2 (two) times daily. One po bid x 7 days Patient not taking: Reported on 03/11/2020 03/01/20   Milton Ferguson, MD     Allergies:     Allergies  Allergen Reactions   Penicillins     unknown     Physical Exam:   Vitals  Blood pressure 122/67, pulse 86, temperature 98.2 F (36.8 C), temperature source Oral, resp. rate 18, height 5\' 1"  (1.549 m), weight 50.6 kg, SpO2 96 %.  Physical Examination: General appearance - alert, well appearing, and in no distress Mental status - alert, oriented to person, place, and time,  Eyes - sclera anicteric Neck - supple, no JVD elevation , Chest - clear  to auscultation bilaterally, symmetrical air movement,  Heart - S1 and S2 normal, irregularly irregular Abdomen - soft, nontender, nondistended, no masses or organomegaly Neurological - screening mental status exam normal, neck supple without rigidity, cranial nerves II through XII intact, DTR's normal and symmetric Extremities - no pedal edema noted, intact peripheral pulses  Skin - warm, dry MSK-left hip shortened and rotated, point tenderness over the  left hip.     Data Review:    CBC Recent Labs  Lab 03/11/20 0726  WBC 6.2  HGB 9.6*  HCT 30.5*  PLT 293  MCV 91.9  MCH 28.9  MCHC 31.5  RDW 14.1  LYMPHSABS 1.7  MONOABS 0.8  EOSABS 0.2  BASOSABS 0.0   ------------------------------------------------------------------------------------------------------------------  Chemistries  Recent Labs  Lab 03/11/20 0726  NA 137  K 3.9  CL 100  CO2 26  GLUCOSE 116*  BUN 18  CREATININE 0.71  CALCIUM 8.8*   ------------------------------------------------------------------------------------------------------------------ estimated  creatinine clearance is 34.6 mL/min (by C-G formula based on SCr of 0.71 mg/dL). ------------------------------------------------------------------------------------------------------------------ No results for input(s): TSH, T4TOTAL, T3FREE, THYROIDAB in the last 72 hours.  Invalid input(s): FREET3   Coagulation profile Recent Labs  Lab 03/11/20 0726  INR 1.1   ------------------------------------------------------------------------------------------------------------------- No results for input(s): DDIMER in the last 72 hours. -------------------------------------------------------------------------------------------------------------------  Cardiac Enzymes No results for input(s): CKMB, TROPONINI, MYOGLOBIN in the last 168 hours.  Invalid input(s): CK ------------------------------------------------------------------------------------------------------------------    Component Value Date/Time   BNP 103.0 (H) 12/08/2019 1640     ---------------------------------------------------------------------------------------------------------------  Urinalysis    Component Value Date/Time   COLORURINE YELLOW 03/01/2020 2100   APPEARANCEUR HAZY (A) 03/01/2020 2100   LABSPEC 1.021 03/01/2020 2100   PHURINE 5.0 03/01/2020 2100   GLUCOSEU NEGATIVE 03/01/2020 2100   HGBUR NEGATIVE 03/01/2020  2100   BILIRUBINUR NEGATIVE 03/01/2020 2100   KETONESUR NEGATIVE 03/01/2020 2100   PROTEINUR 30 (A) 03/01/2020 2100   NITRITE NEGATIVE 03/01/2020 2100   LEUKOCYTESUR TRACE (A) 03/01/2020 2100    ----------------------------------------------------------------------------------------------------------------   Imaging Results:    DG Chest 1 View  Result Date: 03/11/2020 CLINICAL DATA:  Fall, left hip fracture EXAM: CHEST  1 VIEW COMPARISON:  02/13/2020 FINDINGS: Stable heart size and vascularity. No significant focal pneumonia, collapse or consolidation. Negative for edema, effusion or pneumothorax. Aorta atherosclerotic and tortuous. Old left proximal humerus surgical neck fracture with foreshortening and impaction. IMPRESSION: No active chest disease Old left proximal humerus surgical neck fracture Aortic Atherosclerosis (ICD10-I70.0). Electronically Signed   By: Jerilynn Mages.  Shick M.D.   On: 03/11/2020 09:18   DG Pelvis 1-2 Views  Result Date: 03/11/2020 CLINICAL DATA:  Fall, left hip pain EXAM: PELVIS - 1-2 VIEW COMPARISON:  03/11/2020 FINDINGS: There is an acute displaced left hip subcapital femoral neck fracture. Bony pelvis and right hip appear intact. Bones are osteopenic. Aortoiliac atherosclerosis evident. Nonobstructive bowel gas pattern. IMPRESSION: Acute left hip subcapital femoral neck fracture. Osteopenia Aortic Atherosclerosis (ICD10-I70.0). Electronically Signed   By: Jerilynn Mages.  Shick M.D.   On: 03/11/2020 09:19   DG FEMUR MIN 2 VIEWS LEFT  Result Date: 03/11/2020 CLINICAL DATA:  Fall, left hip fracture EXAM: LEFT FEMUR 2 VIEWS COMPARISON:  03/11/2020 FINDINGS: There is an acute left hip subcapital femoral neck fracture with displacement and slight foreshortening. Bones are osteopenic. Distal femur intact at the knee. Femoral and popliteal peripheral atherosclerosis evident. No associated subluxation or dislocation. IMPRESSION: Acute left hip subcapital femoral neck fracture. Osteopenia  Electronically Signed   By: Jerilynn Mages.  Shick M.D.   On: 03/11/2020 09:21    Radiological Exams on Admission: DG Chest 1 View  Result Date: 03/11/2020 CLINICAL DATA:  Fall, left hip fracture EXAM: CHEST  1 VIEW COMPARISON:  02/13/2020 FINDINGS: Stable heart size and vascularity. No significant focal pneumonia, collapse or consolidation. Negative for edema, effusion or pneumothorax. Aorta atherosclerotic and tortuous. Old left proximal humerus surgical neck fracture with foreshortening and impaction. IMPRESSION: No active chest disease Old left proximal humerus surgical neck fracture Aortic Atherosclerosis (ICD10-I70.0). Electronically Signed   By: Jerilynn Mages.  Shick M.D.   On: 03/11/2020 09:18   DG Pelvis 1-2 Views  Result Date: 03/11/2020 CLINICAL DATA:  Fall, left hip pain EXAM: PELVIS - 1-2 VIEW COMPARISON:  03/11/2020 FINDINGS: There is an acute displaced left hip subcapital femoral neck fracture. Bony pelvis and right hip appear intact. Bones are osteopenic. Aortoiliac atherosclerosis evident. Nonobstructive bowel gas pattern. IMPRESSION: Acute left hip subcapital femoral neck fracture. Osteopenia Aortic Atherosclerosis (ICD10-I70.0). Electronically Signed  By: Eugenie Filler M.D.   On: 03/11/2020 09:19   DG FEMUR MIN 2 VIEWS LEFT  Result Date: 03/11/2020 CLINICAL DATA:  Fall, left hip fracture EXAM: LEFT FEMUR 2 VIEWS COMPARISON:  03/11/2020 FINDINGS: There is an acute left hip subcapital femoral neck fracture with displacement and slight foreshortening. Bones are osteopenic. Distal femur intact at the knee. Femoral and popliteal peripheral atherosclerosis evident. No associated subluxation or dislocation. IMPRESSION: Acute left hip subcapital femoral neck fracture. Osteopenia Electronically Signed   By: Jerilynn Mages.  Shick M.D.   On: 03/11/2020 09:21    DVT Prophylaxis -SCD  AM Labs Ordered, also please review Full Orders  Family Communication: Admission, patients condition and plan of care including tests being ordered  have been discussed with the patient and daughter who indicate understanding and agree with the plan   Code Status - Full Code  Likely DC to SNF rehab after hip surgery  Condition   stable  Roxan Hockey M.D on 03/11/2020 at 6:14 PM Go to [email or web address] -  for contact info  [facility] - Office  [phone]

## 2020-03-12 ENCOUNTER — Inpatient Hospital Stay (HOSPITAL_COMMUNITY): Payer: Medicare Other

## 2020-03-12 ENCOUNTER — Inpatient Hospital Stay (HOSPITAL_COMMUNITY): Payer: Medicare Other | Admitting: Anesthesiology

## 2020-03-12 ENCOUNTER — Other Ambulatory Visit (HOSPITAL_COMMUNITY): Payer: Medicare Other

## 2020-03-12 ENCOUNTER — Encounter (HOSPITAL_COMMUNITY): Payer: Self-pay | Admitting: Family Medicine

## 2020-03-12 ENCOUNTER — Encounter (HOSPITAL_COMMUNITY)
Admission: RE | Disposition: A | Discharge status: Skilled Nursing Facility | Payer: Self-pay | Source: Home / Self Care | Attending: Family Medicine

## 2020-03-12 HISTORY — PX: HIP ARTHROPLASTY: SHX981

## 2020-03-12 LAB — PREPARE RBC (CROSSMATCH)

## 2020-03-12 SURGERY — HEMIARTHROPLASTY, HIP, DIRECT ANTERIOR APPROACH, FOR FRACTURE
Anesthesia: Spinal | Site: Hip | Laterality: Left

## 2020-03-12 MED ORDER — LACTATED RINGERS IV SOLN: INTRAVENOUS | Status: DC

## 2020-03-12 MED ORDER — CHLORHEXIDINE GLUCONATE 0.12 % MT SOLN
15.0000 mL | Freq: Once | OROMUCOSAL | Status: AC
  Administered 2020-03-12: 15 mL via OROMUCOSAL

## 2020-03-12 MED ORDER — CHLORHEXIDINE GLUCONATE 0.12 % MT SOLN
OROMUCOSAL | Status: AC
  Filled 2020-03-12: qty 15

## 2020-03-12 MED ORDER — MENTHOL 3 MG MT LOZG: 1.0000 | LOZENGE | OROMUCOSAL | Status: DC | PRN

## 2020-03-12 MED ORDER — ASPIRIN EC 325 MG PO TBEC
325.0000 mg | DELAYED_RELEASE_TABLET | Freq: Every day | ORAL | Status: DC
  Filled 2020-03-12: qty 1

## 2020-03-12 MED ORDER — SODIUM CHLORIDE 0.9 % IR SOLN
Status: DC | PRN
  Administered 2020-03-12: 3000 mL

## 2020-03-12 MED ORDER — METOCLOPRAMIDE HCL 5 MG PO TABS: 5.0000 mg | ORAL_TABLET | Freq: Three times a day (TID) | ORAL | Status: DC | PRN

## 2020-03-12 MED ORDER — DILTIAZEM HCL 30 MG PO TABS
30.0000 mg | ORAL_TABLET | Freq: Every day | ORAL | Status: DC
  Filled 2020-03-12: qty 1

## 2020-03-12 MED ORDER — 0.9 % SODIUM CHLORIDE (POUR BTL) OPTIME
TOPICAL | Status: DC | PRN
  Administered 2020-03-12: 1000 mL

## 2020-03-12 MED ORDER — BUPIVACAINE IN DEXTROSE 0.75-8.25 % IT SOLN
INTRATHECAL | Status: DC | PRN
Start: 2020-03-12 — End: 2020-03-12
  Administered 2020-03-12: 1.7 mL via INTRATHECAL

## 2020-03-12 MED ORDER — PROPOFOL 10 MG/ML IV BOLUS
INTRAVENOUS | Status: DC | PRN
  Administered 2020-03-12 (×4): 10 mg via INTRAVENOUS

## 2020-03-12 MED ORDER — FENTANYL CITRATE (PF) 100 MCG/2ML IJ SOLN
INTRAMUSCULAR | Status: AC
  Filled 2020-03-12: qty 2

## 2020-03-12 MED ORDER — BUPIVACAINE LIPOSOME 1.3 % IJ SUSP
INTRAMUSCULAR | Status: DC | PRN
  Administered 2020-03-12: 20 mL

## 2020-03-12 MED ORDER — PHENYLEPHRINE HCL (PRESSORS) 10 MG/ML IV SOLN
INTRAVENOUS | Status: DC | PRN
Start: 2020-03-12 — End: 2020-03-12
  Administered 2020-03-12: 80 ug via INTRAVENOUS
  Administered 2020-03-12: 60 ug via INTRAVENOUS
  Administered 2020-03-12: 80 ug via INTRAVENOUS
  Administered 2020-03-12: 40 ug via INTRAVENOUS
  Administered 2020-03-12: 60 ug via INTRAVENOUS
  Administered 2020-03-12 (×2): 40 ug via INTRAVENOUS

## 2020-03-12 MED ORDER — TRAMADOL HCL 50 MG PO TABS
50.0000 mg | ORAL_TABLET | Freq: Four times a day (QID) | ORAL | Status: DC
  Administered 2020-03-12 – 2020-03-14 (×7): 50 mg via ORAL
  Filled 2020-03-12 (×7): qty 1

## 2020-03-12 MED ORDER — PHENOL 1.4 % MT LIQD: 1.0000 | OROMUCOSAL | Status: DC | PRN

## 2020-03-12 MED ORDER — BUPIVACAINE-EPINEPHRINE (PF) 0.5% -1:200000 IJ SOLN
INTRAMUSCULAR | Status: AC
  Filled 2020-03-12: qty 30

## 2020-03-12 MED ORDER — MORPHINE SULFATE (PF) 2 MG/ML IV SOLN
1.0000 mg | INTRAVENOUS | Status: DC | PRN
  Administered 2020-03-12: 1 mg via INTRAVENOUS
  Filled 2020-03-12: qty 1

## 2020-03-12 MED ORDER — FENTANYL CITRATE (PF) 100 MCG/2ML IJ SOLN
INTRAMUSCULAR | Status: DC | PRN
  Administered 2020-03-12: 15 ug via INTRAVENOUS
  Administered 2020-03-12 (×2): 25 ug via INTRAVENOUS

## 2020-03-12 MED ORDER — ONDANSETRON HCL 4 MG/2ML IJ SOLN: 4.0000 mg | Freq: Once | INTRAMUSCULAR | Status: DC | PRN

## 2020-03-12 MED ORDER — FENTANYL CITRATE (PF) 100 MCG/2ML IJ SOLN: 25.0000 ug | INTRAMUSCULAR | Status: DC | PRN

## 2020-03-12 MED ORDER — DOCUSATE SODIUM 100 MG PO CAPS
100.0000 mg | ORAL_CAPSULE | Freq: Two times a day (BID) | ORAL | Status: DC
  Administered 2020-03-12: 100 mg via ORAL
  Filled 2020-03-12 (×2): qty 1

## 2020-03-12 MED ORDER — ORAL CARE MOUTH RINSE: 15.0000 mL | Freq: Once | OROMUCOSAL | Status: AC

## 2020-03-12 MED ORDER — ONDANSETRON HCL 4 MG/2ML IJ SOLN: 4.0000 mg | Freq: Four times a day (QID) | INTRAMUSCULAR | Status: DC | PRN

## 2020-03-12 MED ORDER — PROPOFOL 500 MG/50ML IV EMUL
INTRAVENOUS | Status: DC | PRN
  Administered 2020-03-12: 15 ug/kg/min via INTRAVENOUS
  Administered 2020-03-12: 10 ug/kg/min via INTRAVENOUS

## 2020-03-12 MED ORDER — CHLORHEXIDINE GLUCONATE CLOTH 2 % EX PADS
6.0000 | MEDICATED_PAD | Freq: Every day | CUTANEOUS | Status: DC
  Administered 2020-03-12 – 2020-03-14 (×3): 6 via TOPICAL

## 2020-03-12 MED ORDER — ONDANSETRON HCL 4 MG PO TABS: 4.0000 mg | ORAL_TABLET | Freq: Four times a day (QID) | ORAL | Status: DC | PRN

## 2020-03-12 MED ORDER — BUPIVACAINE LIPOSOME 1.3 % IJ SUSP
INTRAMUSCULAR | Status: AC
  Filled 2020-03-12: qty 20

## 2020-03-12 MED ORDER — METOCLOPRAMIDE HCL 5 MG/ML IJ SOLN: 5.0000 mg | Freq: Three times a day (TID) | INTRAMUSCULAR | Status: DC | PRN

## 2020-03-12 MED ORDER — PROPOFOL 10 MG/ML IV BOLUS
INTRAVENOUS | Status: AC
  Filled 2020-03-12: qty 20

## 2020-03-12 SURGICAL SUPPLY — 64 items
BALL HIP ARTICU 28 +5 (Hips) IMPLANT
BIPOLAR PROS AML 46 (Hips) ×2 IMPLANT
BIPOLAR PROS AML 46MM (Hips) ×1 IMPLANT
BIT DRILL 2.8X128 (BIT) ×2 IMPLANT
BIT DRILL 2.8X128MM (BIT) ×1
BLADE SAGITTAL 25.0X1.27X90 (BLADE) ×2 IMPLANT
BLADE SAGITTAL 25.0X1.27X90MM (BLADE) ×1
BLADE SURG SZ10 CARB STEEL (BLADE) ×3 IMPLANT
CATH FOLEY LATEX FREE 14FR (CATHETERS) ×4
CATH FOLEY LF 14FR (CATHETERS) IMPLANT
CHLORAPREP W/TINT 26 (MISCELLANEOUS) ×3 IMPLANT
CLOTH BEACON ORANGE TIMEOUT ST (SAFETY) ×3 IMPLANT
COVER LIGHT HANDLE STERIS (MISCELLANEOUS) ×6 IMPLANT
COVER WAND RF STERILE (DRAPES) ×3 IMPLANT
DECANTER SPIKE VIAL GLASS SM (MISCELLANEOUS) ×3 IMPLANT
DRAPE HIP W/POCKET STRL (MISCELLANEOUS) ×3 IMPLANT
DRAPE U-SHAPE 47X51 STRL (DRAPES) ×3 IMPLANT
DRSG MEPILEX BORDER 4X12 (GAUZE/BANDAGES/DRESSINGS) ×3 IMPLANT
DRSG MEPILEX SACRM 8.7X9.8 (GAUZE/BANDAGES/DRESSINGS) ×3 IMPLANT
ELECT REM PT RETURN 9FT ADLT (ELECTROSURGICAL) ×3
ELECTRODE REM PT RTRN 9FT ADLT (ELECTROSURGICAL) ×1 IMPLANT
GLOVE BIOGEL PI IND STRL 7.0 (GLOVE) ×3 IMPLANT
GLOVE BIOGEL PI INDICATOR 7.0 (GLOVE) ×8
GLOVE ECLIPSE 7.0 STRL STRAW (GLOVE) ×4 IMPLANT
GLOVE SKINSENSE NS SZ8.0 LF (GLOVE) ×4
GLOVE SKINSENSE STRL SZ8.0 LF (GLOVE) ×2 IMPLANT
GLOVE SS N UNI LF 8.5 STRL (GLOVE) ×3 IMPLANT
GOWN STRL REUS W/TWL LRG LVL3 (GOWN DISPOSABLE) ×6 IMPLANT
GOWN STRL REUS W/TWL XL LVL3 (GOWN DISPOSABLE) ×3 IMPLANT
HANDPIECE INTERPULSE COAX TIP (DISPOSABLE) ×2
HEAD BIPOLAR PROS AML 46 (Hips) IMPLANT
HIP BALL ARTICU 28 +5 (Hips) ×3 IMPLANT
INST SET MAJOR BONE (KITS) ×3 IMPLANT
KIT BLADEGUARD II DBL (SET/KITS/TRAYS/PACK) ×3 IMPLANT
KIT TURNOVER KIT A (KITS) ×3 IMPLANT
MANIFOLD NEPTUNE II (INSTRUMENTS) ×3 IMPLANT
MARKER SKIN DUAL TIP RULER LAB (MISCELLANEOUS) ×3 IMPLANT
NDL HYPO 18GX1.5 BLUNT FILL (NEEDLE) ×1 IMPLANT
NDL HYPO 21X1.5 SAFETY (NEEDLE) ×1 IMPLANT
NEEDLE HYPO 18GX1.5 BLUNT FILL (NEEDLE) ×3 IMPLANT
NEEDLE HYPO 21X1.5 SAFETY (NEEDLE) ×3 IMPLANT
NS IRRIG 1000ML POUR BTL (IV SOLUTION) ×3 IMPLANT
PACK TOTAL JOINT (CUSTOM PROCEDURE TRAY) ×3 IMPLANT
PAD ARMBOARD 7.5X6 YLW CONV (MISCELLANEOUS) ×3 IMPLANT
PASSER SUT SWANSON 36MM LOOP (INSTRUMENTS) IMPLANT
PENCIL SMOKE EVACUATOR (MISCELLANEOUS) ×2 IMPLANT
PIN STMN SNGL STERILE 9X3.6MM (PIN) ×6 IMPLANT
SET BASIN LINEN APH (SET/KITS/TRAYS/PACK) ×3 IMPLANT
SET HNDPC FAN SPRY TIP SCT (DISPOSABLE) ×1 IMPLANT
STAPLER VISISTAT 35W (STAPLE) ×3 IMPLANT
STEM SUMMIT BASIC PRESSFIT SZ5 (Hips) ×2 IMPLANT
SUT BRALON NAB BRD #1 30IN (SUTURE) ×8 IMPLANT
SUT ETHIBOND 5 LR DA (SUTURE) ×6 IMPLANT
SUT MNCRL 0 VIOLET CTX 36 (SUTURE) ×1 IMPLANT
SUT MON AB 2-0 CT1 36 (SUTURE) ×1 IMPLANT
SUT MONOCRYL 0 CTX 36 (SUTURE) ×2
SUT VIC AB 1 CT1 27 (SUTURE) ×4
SUT VIC AB 1 CT1 27XBRD ANTBC (SUTURE) ×2 IMPLANT
SYR 20ML LL LF (SYRINGE) ×6 IMPLANT
SYR 30ML LL (SYRINGE) ×3 IMPLANT
SYR BULB IRRIG 60ML STRL (SYRINGE) ×3 IMPLANT
TRAY FOLEY MTR SLVR 16FR STAT (SET/KITS/TRAYS/PACK) ×1 IMPLANT
WATER STERILE IRR 1000ML POUR (IV SOLUTION) ×6 IMPLANT
YANKAUER SUCT 12FT TUBE ARGYLE (SUCTIONS) ×3 IMPLANT

## 2020-03-12 NOTE — NC FL2 (Signed)
[place] LEVEL OF CARE SCREENING TOOL     IDENTIFICATION  Patient Name: Joyce Hogan Birthdate: 03-09-1927 Sex: female Admission Date (Current Location): 03/11/2020  [facility] and [place] Number:  [employer] and Address:  [place] [address] [place]      Provider Number: [id number]  Attending Physician Name and Address:  Roxan Hockey, MD  Relative Name and Phone Number:  Tomasa Rand (niece) Ph: [phone]    Current Level of Care: Hospital Recommended Level of Care: [place] Prior Approval Number:    Date Approved/Denied:   PASRR Number: [id number]  Discharge Plan: SNF    Current Diagnoses: Patient Active Problem List   Diagnosis Date Noted  . Hip fracture ([place]) 03/11/2020  . Fall at home, initial encounter 03/11/2020  . Closed fracture of left proximal humerus 12/04/2019    Orientation RESPIRATION BLADDER Height & Weight     Self, Time, Situation, Place  Normal Incontinent Weight: 111 lb 8.8 oz (50.6 kg) Height:  5\' 1"  (154.9 cm)  BEHAVIORAL SYMPTOMS/MOOD NEUROLOGICAL BOWEL NUTRITION STATUS      Continent Diet (Regular diet)  AMBULATORY STATUS COMMUNICATION OF NEEDS Skin   Extensive Assist Verbally Surgical wounds (Surgical incision on left hip)                       Personal Care Assistance Level of Assistance  Bathing, Feeding, Dressing Bathing Assistance: Limited assistance Feeding assistance: Independent Dressing Assistance: Limited assistance     Functional Limitations Info  Sight, Speech, Hearing Sight Info: Impaired Hearing Info: Impaired Speech Info: Adequate    SPECIAL CARE FACTORS FREQUENCY  PT (By licensed PT)     PT Frequency: Min 3x's/week              Contractures Contractures Info: Not present    Additional Factors Info  Allergies, Code Status Code Status Info: Full Allergies Info: Penicillins           Current Medications (03/12/2020):   This is the current hospital active medication list Current Facility-Administered Medications  Medication Dose Route Frequency Provider Last Rate Last Admin  . 0.9 %  sodium chloride infusion (Manually program via Guardrails IV Fluids)   Intravenous Once Carole Civil, MD      . 0.9 %  sodium chloride infusion   Intravenous Continuous Roxan Hockey, MD 30 mL/hr at 03/12/20 0038 New Bag at 03/12/20 0038  . [START ON 03/13/2020] aspirin EC tablet 325 mg  325 mg Oral Q breakfast Carole Civil, MD      . aspirin EC tablet 81 mg  81 mg Oral Q breakfast Carole Civil, MD   81 mg at 03/11/20 2135  . Chlorhexidine Gluconate Cloth 2 % PADS 6 each  6 each Topical Daily Emokpae, Courage, MD      . diltiazem (CARDIZEM) tablet 30 mg  30 mg Oral Daily Carole Civil, MD      . docusate sodium (COLACE) capsule 100 mg  100 mg Oral BID Carole Civil, MD      . heparin injection 5,000 Units  5,000 Units Subcutaneous Q8H Roxan Hockey, MD   5,000 Units at 03/11/20 2134  . menthol-cetylpyridinium (CEPACOL) lozenge 3 mg  1 lozenge Oral PRN Carole Civil, MD       Or  . phenol (CHLORASEPTIC) mouth spray 1 spray  1 spray Mouth/Throat PRN Carole Civil, MD      .  methocarbamol (ROBAXIN) tablet 500 mg  500 mg Oral TID Carole Civil, MD   500 mg at 03/11/20 2135  . metoCLOPramide (REGLAN) tablet 5-10 mg  5-10 mg Oral Q8H PRN Carole Civil, MD       Or  . metoCLOPramide (REGLAN) injection 5-10 mg  5-10 mg Intravenous Q8H PRN Carole Civil, MD      . metoprolol tartrate (LOPRESSOR) tablet 12.5 mg  12.5 mg Oral BID Carole Civil, MD   12.5 mg at 03/11/20 2136  . morphine 2 MG/ML injection 1 mg  1 mg Intravenous Q2H PRN Carole Civil, MD      . mupirocin ointment (BACTROBAN) 2 % 1 application  1 application Nasal BID Carole Civil, MD   1 application at 11/65/79 2134  . ondansetron (ZOFRAN) tablet 4 mg  4 mg Oral Q6H PRN Carole Civil,  MD       Or  . ondansetron [facility]) injection 4 mg  4 mg Intravenous Q6H PRN Carole Civil, MD      . traMADol Veatrice Bourbon) tablet 50 mg  50 mg Oral Q6H Carole Civil, MD         Discharge Medications: Please see discharge summary for a list of discharge medications.  Relevant Imaging Results:  Relevant Lab Results:   Additional Information SSN#: [id number]  Sherie Don, LCSW

## 2020-03-12 NOTE — Progress Notes (Signed)
Patient Demographics:    Joyce Hogan, is a 86 y.o. female, DOB - [birth year], JAS:[id number]  Admit date - 03/11/2020   Admitting Physician Danthony Kendrix Denton Brick, MD  Outpatient Primary MD for the patient is Neale Burly, MD  LOS - 1  Chief Complaint  Patient presents with  . Fall        Subjective:    Harrell Gave today has no fevers, no emesis,  No chest pain,   --Niece/caregiver at bedside, questions answered  Assessment  & Plan :    Principal Problem:   Hip fracture ([place]) Active Problems:   Fall at home, initial encounter   1) left hip fracture--- status post mechanical fall, orthopedic consult appreciated s/p operative treatment on 03/12/2020 -Further management and DVT prophylaxis as per orthopedic surgeon -As needed fentanyl or oxycodone as needed for pain  2)-history of paroxysmal atrial fibrillation--give metoprolol 12.5 mg twice daily for rate control, -PTA was not on anticoagulation, -Echo and TSH requested - 3) chronic normocytic and normochromic anemia--- etiology unclear at this time, hemoglobin 9.6 which is around patient's prior baseline -No evidence of ongoing bleeding at this time  4) recent left humerus fracture--- apparently healing nicely  5) acute hypoxic respiratory failure in the postop patient--- this may be transient from anesthesia -Monitor closely continue supplemental oxygen for now  Disposition/Need for in-Hospital Stay- patient unable to be discharged at this time due to ---  will need SNF rehab after left hip surgery  Status is: Inpatient  Remains inpatient appropriate because:Inpatient level of care appropriate due to severity of illness   Dispo: The patient is from: Home  Anticipated d/c is to: SNF  Anticipated d/c date is: 2 days  Patient currently is not medically stable to d/c. Barriers: Not Clinically Stable-  --left hip fracture-will need SNF rehab after left hip surgery  Code Status : full  Family Communication:   (patient is alert, awake and coherent)  Discussed with Niece/caregiver at bedside  Consults  :  na  DVT Prophylaxis  :    - Heparin - SCDs   Lab Results  Component Value Date   PLT 293 03/11/2020    Inpatient Medications  Scheduled Meds: . sodium chloride   Intravenous Once  . [START ON 03/13/2020] aspirin EC  325 mg Oral Q breakfast  . aspirin EC  81 mg Oral Q breakfast  . Chlorhexidine Gluconate Cloth  6 each Topical Daily  . diltiazem  30 mg Oral Daily  . docusate sodium  100 mg Oral BID  . heparin injection (subcutaneous)  5,000 Units Subcutaneous Q8H  . methocarbamol  500 mg Oral TID  . metoprolol tartrate  12.5 mg Oral BID  . mupirocin ointment  1 application Nasal BID  . traMADol  50 mg Oral Q6H   Continuous Infusions: . sodium chloride 30 mL/hr at 03/12/20 0038   PRN Meds:.menthol-cetylpyridinium **OR** phenol, metoCLOPramide **OR** metoCLOPramide (REGLAN) injection, morphine injection, ondansetron **OR** ondansetron (ZOFRAN) IV    Anti-infectives (From admission, onward)   Start     Dose/Rate Route Frequency Ordered Stop   03/12/20 0600  vancomycin (VANCOCIN) IVPB 1000 mg/200 mL premix        1,000 mg 200 mL/hr over 60 Minutes Intravenous On call to  O.R. 03/11/20 1950 03/12/20 0937        Objective:   Vitals:   03/12/20 1215 03/12/20 1230 03/12/20 1311 03/12/20 1600  BP: (!) 108/55 (!) 112/45 (!) 123/53 (!) 136/49  Pulse: 79  85 98  Resp: (!) 24 18  20   Temp:   98.2 F (36.8 C) 99 F (37.2 C)  TempSrc:   Oral Oral  SpO2: 100% 99% 98% 96%  Weight:      Height:        Wt Readings from Last 3 Encounters:  03/12/20 50.6 kg  03/01/20 51.7 kg  12/08/19 53.1 kg     Intake/Output Summary (Last 24 hours) at 03/12/2020 1848 Last data filed at 03/12/2020 1137 Gross per 24 hour  Intake 1100 ml  Output 1150 ml  Net -50 ml     Physical  Exam  Gen:- Awake Alert,  In no apparent distress  HEENT:- [place].AT, No sclera icterus Neck-Supple Neck,No JVD,.  Lungs-  CTAB , fair symmetrical air movement CV- S1, S2 normal, regular  Abd-  +ve B.Sounds, Abd Soft, No tenderness,    Extremity/Skin:- No  edema, pedal pulses present  Psych-affect is appropriate, oriented x3 Neuro-generalized weakness no new focal deficits, no tremors MSK-left hip with appropriate postop tenderness   Data Review:   Micro Results Recent Results (from the past 240 hour(s))  SARS Coronavirus 2 by RT PCR (hospital order, performed in [facility] hospital lab) Nasopharyngeal Nasopharyngeal Swab     Status: None   Collection Time: 03/11/20  8:20 AM   Specimen: Nasopharyngeal Swab  Result Value Ref Range Status   SARS Coronavirus 2 NEGATIVE NEGATIVE Final    Comment: (NOTE) SARS-CoV-2 target nucleic acids are NOT DETECTED.  The SARS-CoV-2 RNA is generally detectable in upper and lower respiratory specimens during the acute phase of infection. The lowest concentration of SARS-CoV-2 viral copies this assay can detect is 250 copies / mL. A negative result does not preclude SARS-CoV-2 infection and should not be used as the sole basis for treatment or other patient management decisions.  A negative result may occur with improper specimen collection / handling, submission of specimen other than nasopharyngeal swab, presence of viral mutation(s) within the areas targeted by this assay, and inadequate number of viral copies (<250 copies / mL). A negative result must be combined with clinical observations, patient history, and epidemiological information.  Fact Sheet for Patients:   StrictlyIdeas.no  Fact Sheet for Healthcare Providers: BankingDealers.co.za  This test is not yet approved or  cleared by the [place] FDA and has been authorized for detection and/or diagnosis of SARS-CoV-2 by FDA under an  Emergency Use Authorization (EUA).  This EUA will remain in effect (meaning this test can be used) for the duration of the COVID-19 declaration under Section 564(b)(1) of the Act, 21 U.S.C. section 360bbb-3(b)(1), unless the authorization is terminated or revoked sooner.  Performed at [facility], [address] [place], [place] [address]   Surgical PCR screen     Status: None   Collection Time: 03/11/20  8:45 PM   Specimen: Nasal Mucosa; Nasal Swab  Result Value Ref Range Status   MRSA, PCR NEGATIVE NEGATIVE Final   Staphylococcus aureus NEGATIVE NEGATIVE Final    Comment: (NOTE) The Xpert SA Assay (FDA approved for NASAL specimens in patients 66 years of age and older), is one component of a comprehensive surveillance program. It is not intended to diagnose infection nor to guide or monitor treatment. Performed at [facility], [address], [place], [place] [address]     Radiology Reports DG Chest 1 View  Result Date: 03/11/2020 CLINICAL DATA:  Fall, left hip fracture EXAM: CHEST  1 VIEW COMPARISON:  02/13/2020 FINDINGS: Stable heart size and vascularity. No significant focal pneumonia, collapse or consolidation. Negative for edema, effusion or pneumothorax. Aorta atherosclerotic and tortuous. Old left proximal humerus surgical neck fracture with foreshortening and impaction. IMPRESSION: No active chest disease Old left proximal humerus surgical neck fracture Aortic Atherosclerosis (ICD10-I70.0). Electronically Signed   By: Jerilynn Mages.  Shick M.D.   On: 03/11/2020 09:18   DG Pelvis 1-2 Views  Result Date: 03/11/2020 CLINICAL DATA:  Fall, left hip pain EXAM: PELVIS - 1-2 VIEW COMPARISON:  03/11/2020 FINDINGS: There is an acute displaced left hip subcapital femoral neck fracture. Bony pelvis and right hip appear intact. Bones are osteopenic. Aortoiliac atherosclerosis evident. Nonobstructive bowel gas pattern. IMPRESSION: Acute left hip subcapital femoral neck fracture. Osteopenia Aortic  Atherosclerosis (ICD10-I70.0). Electronically Signed   By: Jerilynn Mages.  Shick M.D.   On: 03/11/2020 09:19   DG Pelvis Portable  Result Date: 03/12/2020 CLINICAL DATA:  Left hip replacement. EXAM: PORTABLE PELVIS 1-2 VIEWS COMPARISON:  03/11/2020. FINDINGS: Diffuse osteopenia. Degenerative change right hip. Left hip replacement. Hardware intact. Anatomic alignment. Pelvic calcifications consistent phleboliths. Left inguinal hernia appears to be present. IMPRESSION: 1.  Left inguinal hernia appears to be present. 2.  Left hip replacement.  No acute bony abnormality. 3.  Diffuse osteopenia.  Degenerative change right hip. Electronically Signed   By: Marcello Moores  Register   On: 03/12/2020 12:05   CT Renal Stone Study  Result Date: 03/01/2020 CLINICAL DATA:  Right flank pain for 2 days EXAM: CT ABDOMEN AND PELVIS WITHOUT CONTRAST TECHNIQUE: Multidetector CT imaging of the abdomen and pelvis was performed following the standard protocol without IV contrast. COMPARISON:  Chest radiograph 12/08/2019, 02/13/2020 FINDINGS: Lower chest: Bibasilar airways thickening and diffuse secretions with more mixed ground-glass and consolidative opacity in the right lower lobe as well as a more consolidative opacity in the periphery of the left lung base as well which could reflect further infection or round atelectasis though an underlying pleural abnormality is not well demonstrated. Mild cardiomegaly. Trace pericardial effusion. Dense calcifications of the included thoracic aorta and aortic valve leaflets as well as the coronary arteries. Hepatobiliary: Smooth liver surface contour. No visible focal liver lesion. Gallbladder demonstrates multiple prominent fold but without pericholecystic inflammation. No visible calcified intraductal gallstones or biliary dilatation. Pancreas: Unremarkable. No pancreatic ductal dilatation or surrounding inflammatory changes. Spleen: Normal in size without focal abnormality. Adrenals/Urinary Tract: Normal  adrenal glands 4 cm fluid attenuation cyst seen in the lower pole left kidney. Additional partially exophytic 1.3 cm. Indeterminate intermediate attenuation cyst measuring 31 HU seen arising from the posterior interpolar left kidney (2/29). Incompletely characterized on this exam. Several smaller subcentimeter hyperattenuating foci are present as well presumably hyperdense cysts (2/26-27). Additional subcentimeter hypoattenuating foci in the kidneys too small to fully characterize on CT imaging but statistically likely benign. Nonobstructing calculi present in the lower poles both kidneys. No visible obstructive urolithiasis along the course of either ureter. Extensive pelvic phleboliths are noted. Circumferential bladder wall thickening, greater than expected for underdistention. Stomach/Bowel: Small hiatal hernia. Distal stomach is unremarkable. Large air filled duodenal diverticulum (5/40) measuring up to 5 cm in size. Duodenum otherwise unremarkable normally crossing the midline abdomen. No small bowel dilatation or wall thickening. Appendix is not visualized. No focal inflammation the vicinity of the cecum to  suggest an occult appendicitis. Moderate colonic stool burden. Portion of the sigmoid colon protruding into a left inguinal hernia without evidence of mechanical bowel obstruction or vascular compromise. Large inspissated rectal stool ball mural thickening and presacral fat stranding measuring up to 8.5 cm in maximal diameter (2/65). Vascular/Lymphatic: Atherosclerotic calcifications throughout the abdominal aorta and branch vessels. No aneurysm or ectasia. No enlarged abdominopelvic lymph nodes. Reproductive: Uterus is surgically absent. 3.6 cm cyst seen in the left ovary. The right ovarian tissue is poorly visualized. Other: Fat and bowel containing left groin hernia. No abdominopelvic free air or fluid. Presacral fat stranding, as above. Musculoskeletal: Multilevel degenerative changes are present in  the imaged portions of the spine. Likely remote compression deformity of T12 with approximately 40% height loss. Likely vertebral body hemangiomata at the L2 and 3 levels. Levocurvature of the lumbar spine, apex L3-4. Minimal compensatory dextrocurvature of the lower thoracic spine. Asymmetric sclerosis of the right SI joint may reflect sequela of prior sacroiliitis. IMPRESSION: 1. Large inspissated rectal stool ball with mural thickening and presacral fat stranding measuring up to 8.5 cm in maximal diameter. Findings are concerning for stercoral colitis. 2. Fat and sigmoid colon containing left groin hernia without evidence of vascular compromise or mechanical obstruction. 3. Circumferential bladder wall thickening, greater than expected for underdistention. Recommend correlation with urinalysis to exclude cystitis. 4. Bibasilar airways thickening and diffuse secretions with more mixed ground-glass and consolidative opacity in the right lower lobe as well as a more consolidative opacity in the periphery of the left lung base as well which could reflect further infection or round atelectasis though an underlying pleural abnormality is not well demonstrated. Recommend follow-up chest CT in 6-8 weeks following appropriate therapy to ensure resolution. 5. Nonobstructing bilateral nephrolithiasis. No visible obstructive urolithiasis along the course of either ureter. 6. Indeterminate intermediate attenuation cyst in the posterior left interpolar kidney. Consider outpatient evaluation with nonemergent renal ultrasound. 7. 3.6 cm left ovarian cyst. Recommend further evaluation with pelvic ultrasound on a nonemergent basis. This recommendation follows ACR consensus guidelines: White Paper of the ACR Incidental Findings Committee II on Adnexal Findings. J Am Coll Radiol 2013:10:675-681. 8. Likely remote compression deformity of T12 with approximately 40% height loss. 9. 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 10.  Asymmetric sclerosis of the right SI joint may reflect sequela of prior sacroiliitis though could correlate for acute symptoms 11. Aortic Atherosclerosis (ICD10-I70.0). Electronically Signed   By: Lovena Le M.D.   On: 03/01/2020 23:12   DG FEMUR MIN 2 VIEWS LEFT  Result Date: 03/11/2020 CLINICAL DATA:  Fall, left hip fracture EXAM: LEFT FEMUR 2 VIEWS COMPARISON:  03/11/2020 FINDINGS: There is an acute left hip subcapital femoral neck fracture with displacement and slight foreshortening. Bones are osteopenic. Distal femur intact at the knee. Femoral and popliteal peripheral atherosclerosis evident. No associated subluxation or dislocation. IMPRESSION: Acute left hip subcapital femoral neck fracture. Osteopenia Electronically Signed   By: Jerilynn Mages.  Shick M.D.   On: 03/11/2020 09:21     CBC Recent Labs  Lab 03/11/20 0726  WBC 6.2  HGB 9.6*  HCT 30.5*  PLT 293  MCV 91.9  MCH 28.9  MCHC 31.5  RDW 14.1  LYMPHSABS 1.7  MONOABS 0.8  EOSABS 0.2  BASOSABS 0.0    Chemistries  Recent Labs  Lab 03/11/20 0726  NA 137  K 3.9  CL 100  CO2 26  GLUCOSE 116*  BUN 18  CREATININE 0.71  CALCIUM 8.8*   ------------------------------------------------------------------------------------------------------------------ No results  for input(s): CHOL, HDL, LDLCALC, TRIG, CHOLHDL, LDLDIRECT in the last 72 hours.  No results found for: HGBA1C ------------------------------------------------------------------------------------------------------------------ Recent Labs    03/11/20 0741  TSH 0.298*   ------------------------------------------------------------------------------------------------------------------ No results for input(s): VITAMINB12, FOLATE, FERRITIN, TIBC, IRON, RETICCTPCT in the last 72 hours.  Coagulation profile Recent Labs  Lab 03/11/20 0726  INR 1.1    No results for input(s): DDIMER in the last 72 hours.  Cardiac Enzymes No results for input(s): CKMB, TROPONINI,  MYOGLOBIN in the last 168 hours.  Invalid input(s): CK ------------------------------------------------------------------------------------------------------------------    Component Value Date/Time   BNP 103.0 (H) 12/08/2019 1640     Roxan Hockey M.D on 03/12/2020 at 6:48 PM  Go to [email or web address] - for contact info  [facility] - Office  [phone]

## 2020-03-12 NOTE — Brief Op Note (Signed)
03/12/2020  11:33 AM  PATIENT:  Joyce Hogan  86 y.o. female  PRE-OPERATIVE DIAGNOSIS:  left hip femoral neck fracture  POST-OPERATIVE DIAGNOSIS:  left hip femoral neck fracture  PROCEDURE:  Procedure(s): ARTHROPLASTY BIPOLAR HIP (HEMIARTHROPLASTY) (Left)   Implant: [employer] Femoral stem [employer] basic press-fit size 5 Head size 46 Neck size 5 Approach direct lateral  Findings complete fracture femoral neck Acetabulum clean  The patient was seen in preop standard preop evaluation confirmed that she was okay for surgery, site confirmation was done and the left hip was marked with the surgeon's initials.  She was brought to the operating room for spinal anesthetic  The nurses attempted to put a Foley catheter in and her anatomy was such that her urethra the opening is tucked and requires significant retraction of the labia to find it is just above the vaginal opening almost hidden.  Patient was then placed on her side axillary roll was placed.  Left leg was prepped and draped sterilely timeout was completed  Site was confirmed as left hip for left hip hemiarthroplasty antibiotics were given with 2 g Ancef  I made a direct lateral approach to the hip over the greater trochanter I divided subcutaneous tissue extended proximally distally divided the fascia in line with the skin incision and retracted it exposing the greater trochanteric bursa which was excised and had hemorrhagic tissue from the trauma  The anterior half of the abductor gluteus medius was retracted from the femur and peeled subperiosteally and tagged with sutures  The gluteus minimus and capsule were split and held as 1 layer and tagged as well.  This exposed the femoral neck fracture which was oblique and would best be classified as a type III Powells fracture  Femoral head was removed it measured 46 mm.  Acetabulum was irrigated and inspected and found to be clean of any arthritis.  Hip was dislocated  anteriorly the proximal femur was prepared with a neck cutting guide, starter hole, canal finder, trochanteric reamer  Serial broaching up to a size 5 was performed no planing was needed  The lesser trochanter was identified and the neck cut was in a good position  I trialed with a 1.5 neck and 46 head and it was loose  I retrialed with a 5 neck and a 46 head and got good stability good leg length good shuck test good sleeping position test good external rotation extension test good flexion internal rotation test  I removed the trials irrigated the joint drill 2 holes in the greater trochanter passed a #5 Ethibond suture placed the prosthesis head and neck relocated the hip retrialed the positioning and motion and found that to be satisfactory  I then closed the capsule and gluteus minimus with a #1 Braylon suture followed by reattachment of the gluteus medius using a #5 Ethibond suture and 2 interrupted #1 Braylon sutures  Second irrigation was performed and the fascia was closed with the leg abducted using 2 interrupted #1 Braylon's and then subcutaneous tissue was closed with 1 layer of interrupted 0 Monocryl and then 1 running layer of interrupted 0 Monocryl and staples  The patient was taken to the recovery room in stable condition leg lengths were checked prior to leaving the operating room and were equal.  [id number]  SURGEON:  Surgeon(s) and Role:    Carole Civil, MD - Primary  PHYSICIAN ASSISTANT:   ASSISTANTS: Corrie Dandy  ANESTHESIA:   spinal  EBL:  150 mL  BLOOD ADMINISTERED:none  DRAINS: none   LOCAL MEDICATIONS USED:  OTHER exparel 20cc  SPECIMEN:  No Specimen  DISPOSITION OF SPECIMEN:  N/A  COUNTS:  YES  TOURNIQUET:  * No tourniquets in log *  DICTATION: .Dragon Dictation  PLAN OF CARE: Admit to inpatient   PATIENT DISPOSITION:  PACU - hemodynamically stable.   Delay start of Pharmacological VTE agent (>24hrs) due to surgical blood loss or risk  of bleeding: yes

## 2020-03-12 NOTE — Op Note (Signed)
03/12/2020  11:33 AM  PATIENT:  Joyce Hogan  86 y.o. female  PRE-OPERATIVE DIAGNOSIS:  left hip femoral neck fracture  POST-OPERATIVE DIAGNOSIS:  left hip femoral neck fracture  PROCEDURE:  Procedure(s): ARTHROPLASTY BIPOLAR HIP (HEMIARTHROPLASTY) (Left)   Implant: [employer] Femoral stem [place] basic press-fit size 5 Head size 46 Neck size 5 Approach direct lateral  Findings complete fracture femoral neck Acetabulum clean  The patient was seen in preop standard preop evaluation confirmed that she was okay for surgery, site confirmation was done and the left hip was marked with the surgeon's initials.  She was brought to the operating room for spinal anesthetic  The nurses attempted to put a Foley catheter in and her anatomy was such that her urethra the opening is tucked and requires significant retraction of the labia to find it is just above the vaginal opening almost hidden.  Patient was then placed on her side axillary roll was placed.  Left leg was prepped and draped sterilely timeout was completed  Site was confirmed as left hip for left hip hemiarthroplasty antibiotics were given with 2 g Ancef  I made a direct lateral approach to the hip over the greater trochanter I divided subcutaneous tissue extended proximally distally divided the fascia in line with the skin incision and retracted it exposing the greater trochanteric bursa which was excised and had hemorrhagic tissue from the trauma  The anterior half of the abductor gluteus medius was retracted from the femur and peeled subperiosteally and tagged with sutures  The gluteus minimus and capsule were split and held as 1 layer and tagged as well.  This exposed the femoral neck fracture which was oblique and would best be classified as a type III Powells fracture  Femoral head was removed it measured 46 mm.  Acetabulum was irrigated and inspected and found to be clean of any arthritis.  Hip was dislocated  anteriorly the proximal femur was prepared with a neck cutting guide, starter hole, canal finder, trochanteric reamer  Serial broaching up to a size 5 was performed no planing was needed  The lesser trochanter was identified and the neck cut was in a good position  I trialed with a 1.5 neck and 46 head and it was loose  I retrialed with a 5 neck and a 46 head and got good stability good leg length good shuck test good sleeping position test good external rotation extension test good flexion internal rotation test  I removed the trials irrigated the joint drill 2 holes in the greater trochanter passed a #5 Ethibond suture placed the prosthesis head and neck relocated the hip retrialed the positioning and motion and found that to be satisfactory  I then closed the capsule and gluteus minimus with a #1 Braylon suture followed by reattachment of the gluteus medius using a #5 Ethibond suture and 2 interrupted #1 Braylon sutures  Second irrigation was performed and the fascia was closed with the leg abducted using 2 interrupted #1 Braylon's and then subcutaneous tissue was closed with 1 layer of interrupted 0 Monocryl and then 1 running layer of interrupted 0 Monocryl and staples  The patient was taken to the recovery room in stable condition leg lengths were checked prior to leaving the operating room and were equal.  [id number]  SURGEON:  Surgeon(s) and Role:    Carole Civil, MD - Primary  PHYSICIAN ASSISTANT:   ASSISTANTS: Corrie Dandy  ANESTHESIA:   spinal  EBL:  150 mL  BLOOD ADMINISTERED:none  DRAINS: none   LOCAL MEDICATIONS USED:  OTHER exparel 20cc  SPECIMEN:  No Specimen  DISPOSITION OF SPECIMEN:  N/A  COUNTS:  YES  TOURNIQUET:  * No tourniquets in log *  DICTATION: .Dragon Dictation  PLAN OF CARE: Admit to inpatient   PATIENT DISPOSITION:  PACU - hemodynamically stable.   Delay start of Pharmacological VTE agent (>24hrs) due to surgical blood loss or risk  of bleeding: yes

## 2020-03-12 NOTE — Interval H&P Note (Signed)
History and Physical Interval Note:  03/12/2020 9:08 AM  Joyce Hogan  has presented today for surgery, with the diagnosis of left hip femoral neck fracture.  The various methods of treatment have been discussed with the patient and family. After consideration of risks, benefits and other options for treatment, the patient has consented to  Procedure(s): ARTHROPLASTY BIPOLAR HIP (HEMIARTHROPLASTY) (Left) as a surgical intervention.  The patient's history has been reviewed, patient examined, no change in status, stable for surgery.  I have reviewed the patient's chart and labs.  Questions were answered to the patient's satisfaction.     Arther Abbott

## 2020-03-12 NOTE — Anesthesia Preprocedure Evaluation (Signed)
Anesthesia Evaluation  Patient identified by MRN, date of birth, ID band Patient awake    Reviewed: Allergy & Precautions, H&P , NPO status , Patient's Chart, lab work & pertinent test results, reviewed documented beta blocker date and time   Airway Mallampati: II  TM Distance: >3 FB Neck ROM: full    Dental no notable dental hx.    Pulmonary neg pulmonary ROS,    Pulmonary exam normal breath sounds clear to auscultation       Cardiovascular Exercise Tolerance: Good negative cardio ROS   Rhythm:regular Rate:Normal     Neuro/Psych negative neurological ROS  negative psych ROS   GI/Hepatic negative GI ROS, Neg liver ROS,   Endo/Other  negative endocrine ROS  Renal/GU negative Renal ROS  negative genitourinary   Musculoskeletal   Abdominal   Peds  Hematology negative hematology ROS (+)   Anesthesia Other Findings   Reproductive/Obstetrics negative OB ROS                             Anesthesia Physical Anesthesia Plan  ASA: II  Anesthesia Plan: Spinal   Post-op Pain Management:    Induction:   PONV Risk Score and Plan: 2  Airway Management Planned:   Additional Equipment:   Intra-op Plan:   Post-operative Plan:   Informed Consent: I have reviewed the patients History and Physical, chart, labs and discussed the procedure including the risks, benefits and alternatives for the proposed anesthesia with the patient or authorized representative who has indicated his/her understanding and acceptance.     Dental Advisory Given  Plan Discussed with: CRNA  Anesthesia Plan Comments:         Anesthesia Quick Evaluation

## 2020-03-12 NOTE — Anesthesia Postprocedure Evaluation (Signed)
Anesthesia Post Note  Patient: Joyce Hogan  Procedure(s) Performed: ARTHROPLASTY BIPOLAR HIP (HEMIARTHROPLASTY) (Left Hip)  Patient location during evaluation: PACU Anesthesia Type: Spinal Level of consciousness: awake and alert and oriented Pain management: pain level controlled Vital Signs Assessment: post-procedure vital signs reviewed and stable Respiratory status: spontaneous breathing Cardiovascular status: blood pressure returned to baseline and stable Postop Assessment: no apparent nausea or vomiting Anesthetic complications: no   No complications documented.   Last Vitals:  Vitals:   03/12/20 1215 03/12/20 1230  BP: (!) 108/55 (!) 112/45  Pulse: 79   Resp: (!) 24 18  Temp:    SpO2: 100% 99%    Last Pain:  Vitals:   03/12/20 1200  TempSrc:   PainSc: 0-No pain                 Sheneika Walstad

## 2020-03-12 NOTE — Transfer of Care (Signed)
Immediate Anesthesia Transfer of Care Note  Patient: Joyce Hogan  Procedure(s) Performed: ARTHROPLASTY BIPOLAR HIP (HEMIARTHROPLASTY) (Left Hip)  Patient Location: PACU  Anesthesia Type:Spinal  Level of Consciousness: awake  Airway & Oxygen Therapy: Patient Spontanous Breathing  Post-op Assessment: Report given to RN  Post vital signs: Reviewed  Last Vitals:  Vitals Value Taken Time  BP 106/62 03/12/20 1136  Temp    Pulse 81 03/12/20 1137  Resp 22 03/12/20 1137  SpO2 92 % 03/12/20 1137  Vitals shown include unvalidated device data.  Last Pain:  Vitals:   03/12/20 0800  TempSrc: Oral  PainSc:       Patients Stated Pain Goal: 7 (04/75/33 9179)  Complications: No complications documented.

## 2020-03-12 NOTE — TOC Initial Note (Signed)
Transition of Care [facility]) - Initial/Assessment Note   Patient Details  Name: Joyce Hogan MRN: [id number] Date of Birth: 02-Aug-1927  Transition of Care [facility]) CM/SW Contact:    Sherie Don, LCSW Phone Number: 03/12/2020, 7:12 PM  Clinical Narrative: Patient is a 86 year old female who was admitted for hip fracture and subsequent surgery. FL2 completed and faxed out.  Expected Discharge Plan: Skilled Nursing Facility Barriers to Discharge: Continued Medical Work up  Patient Goals and CMS Choice Patient states their goals for this hospitalization and ongoing recovery are:: Discharge to SNF for rehab CMS Medicare.gov Compare Post Acute Care list provided to:: Patient Represenative (must comment) Tomasa Rand (niece))  Expected Discharge Plan and Services Expected Discharge Plan: [place] In-house Referral: Clinical Social Work Post Acute Care Choice: [place] Living arrangements for the past 2 months: [place]: PT, RN [place] Agency: [facility] [employer]  Prior Living Arrangements/Services Living arrangements for the past 2 months: [place] with:: Self Patient language and need for interpreter reviewed:: Yes Do you feel safe going back to the place where you live?: Yes      Need for Family Participation in Patient Care: No (Comment) Care giver support system in place?: Yes (comment) Tomasa Rand (niece) Ph: [phone]) Criminal Activity/Legal Involvement Pertinent to Current Situation/Hospitalization: No - Comment as needed  Activities of Daily Living Home Assistive Devices/Equipment: Bedside commode/3-in-1, Walker (specify type) ADL Screening (condition at time of admission) Patient's cognitive ability adequate to safely complete daily activities?: Yes Is the patient deaf or have difficulty hearing?: No Does the patient have difficulty seeing, even when wearing glasses/contacts?: No Does  the patient have difficulty concentrating, remembering, or making decisions?: Yes (at times) Patient able to express need for assistance with ADLs?: Yes Does the patient have difficulty dressing or bathing?: No Independently performs ADLs?: No Communication: Independent Dressing (OT): Needs assistance Is this a change from baseline?: Change from baseline, expected to last >3 days Grooming: Needs assistance Is this a change from baseline?: Change from baseline, expected to last >3 days Feeding: Needs assistance Is this a change from baseline?: Change from baseline, expected to last <3 days Bathing: Needs assistance Is this a change from baseline?: Change from baseline, expected to last >3 days Toileting: Needs assistance Is this a change from baseline?: Change from baseline, expected to last >3days In/Out Bed: Needs assistance Is this a change from baseline?: Change from baseline, expected to last >3 days Walks in Home: Needs assistance Is this a change from baseline?: Change from baseline, expected to last >3 days Does the patient have difficulty walking or climbing stairs?: Yes Weakness of Legs: Left Weakness of Arms/Hands: Left  Permission Sought/Granted Permission sought to share information with : Chartered certified accountant granted to share info w AGENCY: SNFs  Emotional Assessment Appearance:: Appears stated age Attitude/Demeanor/Rapport: Engaged Affect (typically observed): Appropriate, Accepting Orientation: : Oriented to Self, Oriented to Place, Oriented to  Time, Oriented to Situation Alcohol / Substance Use: Not Applicable Psych Involvement: No (comment)  Admission diagnosis:  Hip fracture ([place]) [S72.009A] Fall [W19.XXXA] Fall, initial encounter [id number]] Closed left hip fracture, initial encounter [facility]) [S72.002A] Patient Active Problem List   Diagnosis Date Noted  . Hip fracture ([place]) 03/11/2020  . Fall at home, initial encounter 03/11/2020  .  Closed fracture of left proximal humerus 12/04/2019   PCP:  Neale Burly, MD Pharmacy:   [facility]  [place] [facility] [address] [place] [address] Phone: [phone] Fax: [phone]  Readmission Risk Interventions No flowsheet data found.

## 2020-03-12 NOTE — Anesthesia Procedure Notes (Signed)
Spinal  Patient location during procedure: OR Start time: 03/12/2020 9:35 AM Preanesthetic Checklist Completed: patient identified, IV checked, site marked, risks and benefits discussed, surgical consent, monitors and equipment checked, pre-op evaluation and timeout performed Spinal Block Patient position: left lateral decubitus Prep: Betadine Patient monitoring: heart rate, cardiac monitor, continuous pulse ox and blood pressure Approach: left paramedian Location: L3-4 Injection technique: single-shot Needle Needle type: Spinocan and Quincke  Needle gauge: 22 G Needle length: 12.7 cm Assessment Sensory level: T8 Additional Notes  ATTEMPTS:1 TRAY TC:[id number] TRAY EXPIRATION DATE:11/24/20

## 2020-03-13 ENCOUNTER — Encounter (HOSPITAL_COMMUNITY): Payer: Self-pay | Admitting: Orthopedic Surgery

## 2020-03-13 ENCOUNTER — Inpatient Hospital Stay (HOSPITAL_COMMUNITY): Payer: Medicare Other

## 2020-03-13 DIAGNOSIS — I340 Nonrheumatic mitral (valve) insufficiency: Secondary | ICD-10-CM

## 2020-03-13 DIAGNOSIS — I351 Nonrheumatic aortic (valve) insufficiency: Secondary | ICD-10-CM

## 2020-03-13 DIAGNOSIS — I361 Nonrheumatic tricuspid (valve) insufficiency: Secondary | ICD-10-CM

## 2020-03-13 LAB — BASIC METABOLIC PANEL
Anion gap: 10 (ref 5–15)
BUN: 10 mg/dL (ref 8–23)
CO2: 24 mmol/L (ref 22–32)
Calcium: 7.2 mg/dL — ABNORMAL LOW (ref 8.9–10.3)
Chloride: 99 mmol/L (ref 98–111)
Creatinine, Ser: 0.58 mg/dL (ref 0.44–1.00)
GFR calc Af Amer: >60 mL/min (ref >60)
GFR calc non Af Amer: >60 mL/min (ref >60)
Glucose, Bld: 121 mg/dL — ABNORMAL HIGH (ref 70–99)
Potassium: 3.7 mmol/L (ref 3.5–5.1)
Sodium: 133 mmol/L — ABNORMAL LOW (ref 135–145)

## 2020-03-13 LAB — CBC
HCT: 26.6 % — ABNORMAL LOW (ref 36.0–46.0)
Hemoglobin: 8.3 g/dL — ABNORMAL LOW (ref 12.0–15.0)
MCH: 29 pg (ref 26.0–34.0)
MCHC: 31.2 g/dL (ref 30.0–36.0)
MCV: 93 fL (ref 80.0–100.0)
Platelets: 231 10*3/uL (ref 150–400)
RBC: 2.86 MIL/uL — ABNORMAL LOW (ref 3.87–5.11)
RDW: 14.2 % (ref 11.5–15.5)
WBC: 10.5 10*3/uL (ref 4.0–10.5)
nRBC: 0 % (ref 0.0–0.2)

## 2020-03-13 LAB — ECHOCARDIOGRAM COMPLETE
Height: 61 in
Weight: 1784.84 oz

## 2020-03-13 MED ORDER — DILTIAZEM HCL 30 MG PO TABS
30.0000 mg | ORAL_TABLET | Freq: Two times a day (BID) | ORAL | Status: DC
  Administered 2020-03-13 – 2020-03-14 (×2): 30 mg via ORAL
  Filled 2020-03-13 (×2): qty 1

## 2020-03-13 MED ORDER — SENNOSIDES-DOCUSATE SODIUM 8.6-50 MG PO TABS
2.0000 | ORAL_TABLET | Freq: Every day | ORAL | Status: DC
  Administered 2020-03-13: 2 via ORAL
  Filled 2020-03-13: qty 2

## 2020-03-13 MED ORDER — ASPIRIN 325 MG PO TABS
325.0000 mg | ORAL_TABLET | Freq: Every day | ORAL | Status: DC
  Administered 2020-03-13 – 2020-03-14 (×2): 325 mg via ORAL
  Filled 2020-03-13 (×2): qty 1

## 2020-03-13 NOTE — TOC Progression Note (Signed)
Transition of Care [facility]) - Progression Note    Patient Details  Name: Joyce Hogan MRN: [id number] Date of Birth: October 19, 1926  Transition [facility]) CM/SW Contact  Boneta Lucks, RN Phone Number: 03/13/2020, 1:50 PM  Clinical Narrative:   [facility] accepting . TOC  Started INS AUTH. Patient is medically ready. She has not had COVID vaccine. TOC to follow.    Expected Discharge Plan: Skilled Nursing Facility Barriers to Discharge: Ship broker  Expected Discharge Plan and Services Expected Discharge Plan: [place] In-house Referral: Clinical Social Work   Post Acute Care Choice: [place] Living arrangements for the past 2 months: [place]: PT, RN [place] Agency: [facility] [employer]

## 2020-03-13 NOTE — Progress Notes (Signed)
Patient Demographics:    Joyce Hogan, is a 86 y.o. female, DOB - 01-Jan-1927, KPT:[id number]  Admit date - 03/11/2020   Admitting Physician Naelle Diegel Denton Brick, MD  Outpatient Primary MD for the patient is Neale Burly, MD  LOS - 2  Chief Complaint  Patient presents with  . Fall        Subjective:    Harrell Gave today has no fevers, no emesis,  No chest pain,   --Niece/caregiver at bedside, questions answered -- No fever  Or chills  -  Assessment  & Plan :    Principal Problem:   Hip fracture ([place]) Active Problems:   Fall at home, initial encounter    1)Left Hip Fracture--- status post mechanical fall, orthopedic consult appreciated s/p operative treatment on 03/12/2020 -Further management and DVT prophylaxis as per orthopedic surgeon -As needed fentanyl or oxycodone as needed for pain  2)-History of paroxysmal atrial fibrillation--  -EF is preserved at 60 to 65% as per echo, no evidence of diastolic dysfunction on echocardiogram  --okay to continue Cardizem at 30 mg twice daily -PTA was not on anticoagulation, -TSH is 0.3 - 3) -Suspect acute on chronic anemia secondary to perioperative acute blood loss--- --at baseline patient has chronic normocytic and normochromic anemia--- etiology unclear at this time, hemoglobin 8.3   -No evidence of ongoing bleeding at this time  4) recent left humerus fracture--- apparently healing nicely  5) acute hypoxic respiratory failure in the postop patient--- this may be transient from anesthesia -Monitor closely continue supplemental oxygen for now  Disposition/Need for in-Hospital Stay- patient unable to be discharged at this time due to ---  physical therapy recommends SNF rehab    Status is: Inpatient  Remains inpatient appropriate because:Inpatient level of care appropriate due to severity of illness   Dispo: The patient is from:  Home  Anticipated d/c is to: SNF  Anticipated d/c date is: 2 days  Patient currently is  medically stable to d/c.  Barriers: Awaiting insurance approval for transfer to SNF rehab  Code Status : full  Family Communication:   (patient is alert, awake and coherent)  Discussed with Niece/caregiver at bedside  Consults  :  na  DVT Prophylaxis  :    - Heparin/ASA - SCDs   Lab Results  Component Value Date   PLT 231 03/13/2020    Inpatient Medications  Scheduled Meds: . sodium chloride   Intravenous Once  . aspirin  325 mg Oral Daily  . Chlorhexidine Gluconate Cloth  6 each Topical Daily  . diltiazem  30 mg Oral BID  . heparin injection (subcutaneous)  5,000 Units Subcutaneous Q8H  . methocarbamol  500 mg Oral TID  . mupirocin ointment  1 application Nasal BID  . senna-docusate  2 tablet Oral QHS  . traMADol  50 mg Oral Q6H   Continuous Infusions: . sodium chloride 10 mL/hr at 03/13/20 0955   PRN Meds:.menthol-cetylpyridinium **OR** phenol, metoCLOPramide **OR** metoCLOPramide (REGLAN) injection, morphine injection, ondansetron **OR** ondansetron (ZOFRAN) IV   Anti-infectives (From admission, onward)   Start     Dose/Rate Route Frequency Ordered Stop   03/12/20 0600  vancomycin (VANCOCIN) IVPB 1000 mg/200 mL premix        1,000 mg 200 mL/hr  over 60 Minutes Intravenous On call to O.R. 03/11/20 1950 03/12/20 0937        Objective:   Vitals:   03/13/20 0450 03/13/20 0752 03/13/20 0850 03/13/20 1250  BP: (!) 126/56  124/70 (!) 100/42  Pulse: 92  (!) 101 84  Resp: 18  17 18   Temp: 100.3 F (37.9 C)  99.5 F (37.5 C) 98.8 F (37.1 C)  TempSrc: Oral  Oral Oral  SpO2: 98% 96% 97% 94%  Weight:      Height:        Wt Readings from Last 3 Encounters:  03/12/20 50.6 kg  03/01/20 51.7 kg  12/08/19 53.1 kg     Intake/Output Summary (Last 24 hours) at 03/13/2020 1833 Last data filed at 03/13/2020 1700 Gross per 24 hour  Intake  2811.11 ml  Output 600 ml  Net 2211.11 ml    Physical Exam  Gen:- Awake Alert,  In no apparent distress  HEENT:- [place].AT, No sclera icterus Neck-Supple Neck,No JVD,.  Lungs-  CTAB , fair symmetrical air movement CV- S1, S2 normal, irregular  Abd-  +ve B.Sounds, Abd Soft, No tenderness,    Extremity/Skin:- No  edema, pedal pulses present  Psych-affect is appropriate, oriented x3 Neuro-generalized weakness no new focal deficits, no tremors MSK-left hip with appropriate postop tenderness   Data Review:   Micro Results Recent Results (from the past 240 hour(s))  SARS Coronavirus 2 by RT PCR (hospital order, performed in [facility] hospital lab) Nasopharyngeal Nasopharyngeal Swab     Status: None   Collection Time: 03/11/20  8:20 AM   Specimen: Nasopharyngeal Swab  Result Value Ref Range Status   SARS Coronavirus 2 NEGATIVE NEGATIVE Final    Comment: (NOTE) SARS-CoV-2 target nucleic acids are NOT DETECTED.  The SARS-CoV-2 RNA is generally detectable in upper and lower respiratory specimens during the acute phase of infection. The lowest concentration of SARS-CoV-2 viral copies this assay can detect is 250 copies / mL. A negative result does not preclude SARS-CoV-2 infection and should not be used as the sole basis for treatment or other patient management decisions.  A negative result may occur with improper specimen collection / handling, submission of specimen other than nasopharyngeal swab, presence of viral mutation(s) within the areas targeted by this assay, and inadequate number of viral copies (<250 copies / mL). A negative result must be combined with clinical observations, patient history, and epidemiological information.  Fact Sheet for Patients:   StrictlyIdeas.no  Fact Sheet for Healthcare Providers: BankingDealers.co.za  This test is not yet approved or  cleared by the [place] FDA and has been authorized for  detection and/or diagnosis of SARS-CoV-2 by FDA under an Emergency Use Authorization (EUA).  This EUA will remain in effect (meaning this test can be used) for the duration of the COVID-19 declaration under Section 564(b)(1) of the Act, 21 U.S.C. section 360bbb-3(b)(1), unless the authorization is terminated or revoked sooner.  Performed at [facility], [address], [place], [place] [address]   Surgical PCR screen     Status: None   Collection Time: 03/11/20  8:45 PM   Specimen: Nasal Mucosa; Nasal Swab  Result Value Ref Range Status   MRSA, PCR NEGATIVE NEGATIVE Final   Staphylococcus aureus NEGATIVE NEGATIVE Final    Comment: (NOTE) The Xpert SA Assay (FDA approved for NASAL specimens in patients 66 years of age and older), is one component of a comprehensive surveillance program. It is not intended to diagnose infection nor to  guide or monitor treatment. Performed at [facility], [address] [place], [place] [address]     Radiology Reports DG Chest 1 View  Result Date: 03/11/2020 CLINICAL DATA:  Fall, left hip fracture EXAM: CHEST  1 VIEW COMPARISON:  02/13/2020 FINDINGS: Stable heart size and vascularity. No significant focal pneumonia, collapse or consolidation. Negative for edema, effusion or pneumothorax. Aorta atherosclerotic and tortuous. Old left proximal humerus surgical neck fracture with foreshortening and impaction. IMPRESSION: No active chest disease Old left proximal humerus surgical neck fracture Aortic Atherosclerosis (ICD10-I70.0). Electronically Signed   By: Jerilynn Mages.  Shick M.D.   On: 03/11/2020 09:18   DG Pelvis 1-2 Views  Result Date: 03/11/2020 CLINICAL DATA:  Fall, left hip pain EXAM: PELVIS - 1-2 VIEW COMPARISON:  03/11/2020 FINDINGS: There is an acute displaced left hip subcapital femoral neck fracture. Bony pelvis and right hip appear intact. Bones are osteopenic. Aortoiliac atherosclerosis evident. Nonobstructive bowel gas pattern. IMPRESSION: Acute left  hip subcapital femoral neck fracture. Osteopenia Aortic Atherosclerosis (ICD10-I70.0). Electronically Signed   By: Jerilynn Mages.  Shick M.D.   On: 03/11/2020 09:19   DG Pelvis Portable  Result Date: 03/12/2020 CLINICAL DATA:  Left hip replacement. EXAM: PORTABLE PELVIS 1-2 VIEWS COMPARISON:  03/11/2020. FINDINGS: Diffuse osteopenia. Degenerative change right hip. Left hip replacement. Hardware intact. Anatomic alignment. Pelvic calcifications consistent phleboliths. Left inguinal hernia appears to be present. IMPRESSION: 1.  Left inguinal hernia appears to be present. 2.  Left hip replacement.  No acute bony abnormality. 3.  Diffuse osteopenia.  Degenerative change right hip. Electronically Signed   By: Marcello Moores  Register   On: 03/12/2020 12:05   ECHOCARDIOGRAM COMPLETE  Result Date: 03/13/2020    ECHOCARDIOGRAM REPORT   Patient Name:   VERNITA TAGUE Date of Exam: 03/13/2020 Medical Rec #:  [id number]   Height:       61.0 in Accession #:    [id number]  Weight:       111.6 lb Date of Birth:  09-Jun-1927   BSA:          1.474 m Patient Age:    48 years    BP:           100/42 mmHg Patient Gender: F           HR:           84 bpm. Exam Location:  [facility] Procedure: 2D Echo Indications:    Abnormal ECG 794.31 / R94.31  History:        Patient has no prior history of Echocardiogram examinations.                 Arrythmias:Atrial Fibrillation; Risk Factors:Non-Smoker. Hip                 Fracture.  Sonographer:    Leavy Cella RDCS (AE) Referring Phys: [id number] Jaryn Hocutt IMPRESSIONS  1. Left ventricular ejection fraction, by estimation, is 60 to 65%. The left ventricle has normal function. The left ventricle has no regional wall motion abnormalities. Left ventricular diastolic parameters were normal.  2. Right ventricular systolic function is normal. The right ventricular size is normal. There is moderately elevated pulmonary artery systolic pressure.  3. The mitral valve is abnormal. Mild mitral valve regurgitation.  4.  The aortic valve is abnormal. Aortic valve regurgitation is mild. Mild aortic valve sclerosis is present, with no evidence of aortic valve stenosis.  5. The inferior vena cava is normal in size with greater than 50% respiratory variability, suggesting right  atrial pressure of 3 mmHg. FINDINGS  Left Ventricle: Left ventricular ejection fraction, by estimation, is 60 to 65%. The left ventricle has normal function. The left ventricle has no regional wall motion abnormalities. The left ventricular internal cavity size was normal in size. There is  no left ventricular hypertrophy. Left ventricular diastolic parameters were normal. Right Ventricle: The right ventricular size is normal. Right vetricular wall thickness was not assessed. Right ventricular systolic function is normal. There is moderately elevated pulmonary artery systolic pressure. The tricuspid regurgitant velocity is  3.41 m/s, and with an assumed right atrial pressure of 10 mmHg, the estimated right ventricular systolic pressure is 34.2 mmHg. Left Atrium: Left atrial size was normal in size. Right Atrium: Right atrial size was normal in size. Pericardium: There is no evidence of pericardial effusion. Mitral Valve: The mitral valve is abnormal. There is mild thickening of the mitral valve leaflet(s). Mild mitral valve regurgitation. Tricuspid Valve: The tricuspid valve is normal in structure. Tricuspid valve regurgitation is mild. Aortic Valve: The aortic valve is abnormal. Aortic valve regurgitation is mild. Aortic regurgitation PHT measures 509 msec. Mild aortic valve sclerosis is present, with no evidence of aortic valve stenosis. Pulmonic Valve: The pulmonic valve was not well visualized. Pulmonic valve regurgitation is not visualized. Aorta: The aortic root is normal in size and structure. Venous: The inferior vena cava is normal in size with greater than 50% respiratory variability, suggesting right atrial pressure of 3 mmHg. IAS/Shunts: No atrial  level shunt detected by color flow Doppler.  LEFT VENTRICLE PLAX 2D LVIDd:         4.21 cm  Diastology LVIDs:         3.30 cm  LV e' lateral:   12.40 cm/s LV PW:         0.98 cm  LV E/e' lateral: 7.2 LV IVS:        1.05 cm  LV e' medial:    8.05 cm/s LVOT diam:     1.90 cm  LV E/e' medial:  11.0 LVOT Area:     2.84 cm  RIGHT VENTRICLE RV S prime:     15.20 cm/s LEFT ATRIUM             Index       RIGHT ATRIUM           Index LA diam:        3.90 cm 2.65 cm/m  RA Area:     11.90 cm LA Vol (A2C):   42.6 ml 28.91 ml/m RA Volume:   26.40 ml  17.91 ml/m LA Vol (A4C):   38.0 ml 25.78 ml/m LA Biplane Vol: 40.3 ml 27.35 ml/m  AORTIC VALVE AI PHT:      509 msec  AORTA Ao Root diam: 2.80 cm MITRAL VALVE               TRICUSPID VALVE MV Area (PHT): 3.72 cm    TR Peak grad:   46.5 mmHg MV Decel Time: 204 msec    TR Vmax:        341.00 cm/s MR Peak grad: 91.4 mmHg MR Mean grad: 68.0 mmHg    SHUNTS MR Vmax:      478.00 cm/s  Systemic Diam: 1.90 cm MR Vmean:     396.0 cm/s MV E velocity: 88.80 cm/s MV A velocity: 70.30 cm/s MV E/A ratio:  1.26 Dorris Carnes MD Electronically signed by Dorris Carnes MD Signature Date/Time: 03/13/2020/5:05:57 PM    Final  CT Renal Stone Study  Result Date: 03/01/2020 CLINICAL DATA:  Right flank pain for 2 days EXAM: CT ABDOMEN AND PELVIS WITHOUT CONTRAST TECHNIQUE: Multidetector CT imaging of the abdomen and pelvis was performed following the standard protocol without IV contrast. COMPARISON:  Chest radiograph 12/08/2019, 02/13/2020 FINDINGS: Lower chest: Bibasilar airways thickening and diffuse secretions with more mixed ground-glass and consolidative opacity in the right lower lobe as well as a more consolidative opacity in the periphery of the left lung base as well which could reflect further infection or round atelectasis though an underlying pleural abnormality is not well demonstrated. Mild cardiomegaly. Trace pericardial effusion. Dense calcifications of the included thoracic aorta and  aortic valve leaflets as well as the coronary arteries. Hepatobiliary: Smooth liver surface contour. No visible focal liver lesion. Gallbladder demonstrates multiple prominent fold but without pericholecystic inflammation. No visible calcified intraductal gallstones or biliary dilatation. Pancreas: Unremarkable. No pancreatic ductal dilatation or surrounding inflammatory changes. Spleen: Normal in size without focal abnormality. Adrenals/Urinary Tract: Normal adrenal glands 4 cm fluid attenuation cyst seen in the lower pole left kidney. Additional partially exophytic 1.3 cm. Indeterminate intermediate attenuation cyst measuring 31 HU seen arising from the posterior interpolar left kidney (2/29). Incompletely characterized on this exam. Several smaller subcentimeter hyperattenuating foci are present as well presumably hyperdense cysts (2/26-27). Additional subcentimeter hypoattenuating foci in the kidneys too small to fully characterize on CT imaging but statistically likely benign. Nonobstructing calculi present in the lower poles both kidneys. No visible obstructive urolithiasis along the course of either ureter. Extensive pelvic phleboliths are noted. Circumferential bladder wall thickening, greater than expected for underdistention. Stomach/Bowel: Small hiatal hernia. Distal stomach is unremarkable. Large air filled duodenal diverticulum (5/40) measuring up to 5 cm in size. Duodenum otherwise unremarkable normally crossing the midline abdomen. No small bowel dilatation or wall thickening. Appendix is not visualized. No focal inflammation the vicinity of the cecum to suggest an occult appendicitis. Moderate colonic stool burden. Portion of the sigmoid colon protruding into a left inguinal hernia without evidence of mechanical bowel obstruction or vascular compromise. Large inspissated rectal stool ball mural thickening and presacral fat stranding measuring up to 8.5 cm in maximal diameter (2/65).  Vascular/Lymphatic: Atherosclerotic calcifications throughout the abdominal aorta and branch vessels. No aneurysm or ectasia. No enlarged abdominopelvic lymph nodes. Reproductive: Uterus is surgically absent. 3.6 cm cyst seen in the left ovary. The right ovarian tissue is poorly visualized. Other: Fat and bowel containing left groin hernia. No abdominopelvic free air or fluid. Presacral fat stranding, as above. Musculoskeletal: Multilevel degenerative changes are present in the imaged portions of the spine. Likely remote compression deformity of T12 with approximately 40% height loss. Likely vertebral body hemangiomata at the L2 and 3 levels. Levocurvature of the lumbar spine, apex L3-4. Minimal compensatory dextrocurvature of the lower thoracic spine. Asymmetric sclerosis of the right SI joint may reflect sequela of prior sacroiliitis. IMPRESSION: 1. Large inspissated rectal stool ball with mural thickening and presacral fat stranding measuring up to 8.5 cm in maximal diameter. Findings are concerning for stercoral colitis. 2. Fat and sigmoid colon containing left groin hernia without evidence of vascular compromise or mechanical obstruction. 3. Circumferential bladder wall thickening, greater than expected for underdistention. Recommend correlation with urinalysis to exclude cystitis. 4. Bibasilar airways thickening and diffuse secretions with more mixed ground-glass and consolidative opacity in the right lower lobe as well as a more consolidative opacity in the periphery of the left lung base as well which could reflect further infection or  round atelectasis though an underlying pleural abnormality is not well demonstrated. Recommend follow-up chest CT in 6-8 weeks following appropriate therapy to ensure resolution. 5. Nonobstructing bilateral nephrolithiasis. No visible obstructive urolithiasis along the course of either ureter. 6. Indeterminate intermediate attenuation cyst in the posterior left interpolar  kidney. Consider outpatient evaluation with nonemergent renal ultrasound. 7. 3.6 cm left ovarian cyst. Recommend further evaluation with pelvic ultrasound on a nonemergent basis. This recommendation follows ACR consensus guidelines: White Paper of the ACR Incidental Findings Committee II on Adnexal Findings. J Am Coll Radiol 2013:10:675-681. 8. Likely remote compression deformity of T12 with approximately 40% height loss. 9. 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 10. Asymmetric sclerosis of the right SI joint may reflect sequela of prior sacroiliitis though could correlate for acute symptoms 11. Aortic Atherosclerosis (ICD10-I70.0). Electronically Signed   By: Lovena Le M.D.   On: 03/01/2020 23:12   DG FEMUR MIN 2 VIEWS LEFT  Result Date: 03/11/2020 CLINICAL DATA:  Fall, left hip fracture EXAM: LEFT FEMUR 2 VIEWS COMPARISON:  03/11/2020 FINDINGS: There is an acute left hip subcapital femoral neck fracture with displacement and slight foreshortening. Bones are osteopenic. Distal femur intact at the knee. Femoral and popliteal peripheral atherosclerosis evident. No associated subluxation or dislocation. IMPRESSION: Acute left hip subcapital femoral neck fracture. Osteopenia Electronically Signed   By: Jerilynn Mages.  Shick M.D.   On: 03/11/2020 09:21     CBC Recent Labs  Lab 03/11/20 0726 03/13/20 0533  WBC 6.2 10.5  HGB 9.6* 8.3*  HCT 30.5* 26.6*  PLT 293 231  MCV 91.9 93.0  MCH 28.9 29.0  MCHC 31.5 31.2  RDW 14.1 14.2  LYMPHSABS 1.7  --   MONOABS 0.8  --   EOSABS 0.2  --   BASOSABS 0.0  --     Chemistries  Recent Labs  Lab 03/11/20 0726 03/13/20 0533  NA 137 133*  K 3.9 3.7  CL 100 99  CO2 26 24  GLUCOSE 116* 121*  BUN 18 10  CREATININE 0.71 0.58  CALCIUM 8.8* 7.2*   ------------------------------------------------------------------------------------------------------------------ No results for input(s): CHOL, HDL, LDLCALC, TRIG, CHOLHDL, LDLDIRECT in the last 72 hours.  No  results found for: HGBA1C ------------------------------------------------------------------------------------------------------------------ Recent Labs    03/11/20 0741  TSH 0.298*   ------------------------------------------------------------------------------------------------------------------ No results for input(s): VITAMINB12, FOLATE, FERRITIN, TIBC, IRON, RETICCTPCT in the last 72 hours.  Coagulation profile Recent Labs  Lab 03/11/20 0726  INR 1.1    No results for input(s): DDIMER in the last 72 hours.  Cardiac Enzymes No results for input(s): CKMB, TROPONINI, MYOGLOBIN in the last 168 hours.  Invalid input(s): CK ------------------------------------------------------------------------------------------------------------------    Component Value Date/Time   BNP 103.0 (H) 12/08/2019 1640     Roxan Hockey M.D on 03/13/2020 at 6:33 PM  Go to [email or web address] - for contact info  [facility] - Office  [phone]

## 2020-03-13 NOTE — Progress Notes (Signed)
*  PRELIMINARY RESULTS* Echocardiogram 2D Echocardiogram has been performed.  Leavy Cella 03/13/2020, 4:23 PM

## 2020-03-13 NOTE — Care Management Important Message (Signed)
Important Message  Patient Details  Name: Joyce Hogan MRN: [id number] Date of Birth: 1926-10-07   Medicare Important Message Given:  Yes     Tommy Medal 03/13/2020, 4:30 PM

## 2020-03-13 NOTE — Plan of Care (Signed)
°  Problem: Acute Rehab PT Goals(only PT should resolve) Goal: Pt Will Go Supine/Side To Sit Outcome: Progressing Flowsheets (Taken 03/13/2020 1424) Pt will go Supine/Side to Sit: with moderate assist Goal: Patient Will Transfer Sit To/From Stand Outcome: Progressing Flowsheets (Taken 03/13/2020 1424) Patient will transfer sit to/from stand: with moderate assist Goal: Pt Will Transfer Bed To Chair/Chair To Bed Outcome: Progressing Flowsheets (Taken 03/13/2020 1424) Pt will Transfer Bed to Chair/Chair to Bed: with mod assist Goal: Pt Will Ambulate Outcome: Progressing Flowsheets (Taken 03/13/2020 1424) Pt will Ambulate:  15 feet  with moderate assist  with rolling walker   2:24 PM, 03/13/20 Lonell Grandchild, MPT Physical Therapist with [facility] [phone] office [phone] mobile phone

## 2020-03-13 NOTE — Evaluation (Signed)
Physical Therapy Evaluation Patient Details Name: Joyce Hogan MRN: [id number] DOB: Sep 26, 1927 Today's Date: 03/13/2020   History of Present Illness  Joyce Hogan  is a 86 y.o. female s/p Left THA on 03/12/20 with history of paroxysmal atrial fibrillation and osteoarthritis presents from home after mechanical fall landing on the left side with left hip fracture-No syncope, no chest pain no palpitation no dizziness-Patient apparently was ambulating at home without her walker and had a mechanical fall    Clinical Impression  Patient demonstrates slow labored movement for sitting up at bedside requiring assistance to move LLE and support when sitting due to LUE weakness (recent shoulder fracture), limited to a few slow unsteady side steps at bedside with poor tolerance for weightbearing on LLE due to increased pain.  Patient tolerated sitting up in chair after therapy her her niece present in room - RN/NT notified.  Patient will benefit from continued physical therapy in hospital and recommended venue below to increase strength, balance, endurance for safe ADLs and gait.     Follow Up Recommendations SNF    Equipment Recommendations  None recommended by PT    Recommendations for Other Services       Precautions / Restrictions Precautions Precautions: Fall Restrictions Weight Bearing Restrictions: Yes LLE Weight Bearing: Weight bearing as tolerated      Mobility  Bed Mobility Overal bed mobility: Needs Assistance Bed Mobility: Supine to Sit     Supine to sit: Mod assist;Max assist     General bed mobility comments: slow labored movement  Transfers Overall transfer level: Needs assistance   Transfers: Sit to/from Stand;Stand Pivot Transfers Sit to Stand: Mod assist Stand pivot transfers: Mod assist;Max assist       General transfer comment: slow labored movement with diffiuclty advancing RLE due to increased pain on LLE when weightbearing  Ambulation/Gait Ambulation/Gait  assistance: Max assist Gait Distance (Feet): 4 Feet Assistive device: Rolling walker (2 wheeled) Gait Pattern/deviations: Decreased step length - right;Decreased step length - left;Decreased stride length;Antalgic Gait velocity: decreased   General Gait Details: limited to 4-5 very slow labored unsteady steps with c/o severe pain left hip with movement/weightbearing  Stairs            Wheelchair Mobility    Modified Rankin (Stroke Patients Only)       Balance Overall balance assessment: Needs assistance Sitting-balance support: Feet supported;No upper extremity supported Sitting balance-Leahy Scale: Fair Sitting balance - Comments: seated at EOB   Standing balance support: During functional activity;Bilateral upper extremity supported Standing balance-Leahy Scale: Poor Standing balance comment: using RW                             Pertinent Vitals/Pain Pain Assessment: Faces Faces Pain Scale: Hurts even more Pain Location: left hip with movement Pain Descriptors / Indicators: Sharp;Sore;Grimacing;Guarding Pain Intervention(s): Limited activity within patient's tolerance;Monitored during session;Repositioned;Patient requesting pain meds-RN notified    Home Living Family/patient expects to be discharged to:: Private residence Living Arrangements: Alone Available Help at Discharge: Family;Personal care attendant;Available 24 hours/day Type of Home: House Home Access: Stairs to enter Entrance Stairs-Rails: None Entrance Stairs-Number of Steps: 1 Home Layout: Two level;Able to live on main level with bedroom/bathroom;Full bath on main level Home Equipment: Walker - 2 wheels;Bedside commode;Shower seat      Prior Function Level of Independence: Needs assistance   Gait / Transfers Assistance Needed: household ambulator using RW  ADL's / Homemaking Assistance Needed: home aides  6 hours/day x 3-4 days/week, family assist during night, patient's neice living  with her        Hand Dominance        Extremity/Trunk Assessment   Upper Extremity Assessment Upper Extremity Assessment: Generalized weakness    Lower Extremity Assessment Lower Extremity Assessment: Generalized weakness;LLE deficits/detail LLE Deficits / Details: grossly -3/5 LLE: Unable to fully assess due to pain LLE Sensation: WNL LLE Coordination: WNL    Cervical / Trunk Assessment Cervical / Trunk Assessment: Normal  Communication   Communication: No difficulties  Cognition Arousal/Alertness: Awake/alert Behavior During Therapy: WFL for tasks assessed/performed Overall Cognitive Status: Within Functional Limits for tasks assessed                                        General Comments      Exercises     Assessment/Plan    PT Assessment Patient needs continued PT services  PT Problem List Decreased strength;Decreased activity tolerance;Decreased balance;Decreased mobility       PT Treatment Interventions Gait training;Stair training;Functional mobility training;Therapeutic activities;Therapeutic exercise;Patient/family education;Balance training    PT Goals (Current goals can be found in the Care Plan section)  Acute Rehab PT Goals Patient Stated Goal: return PT Goal Formulation: With patient/family Time For Goal Achievement: 03/27/20 Potential to Achieve Goals: Good    Frequency Min 4X/week   Barriers to discharge        Co-evaluation               AM-PAC PT "6 Clicks" Mobility  Outcome Measure Help needed turning from your back to your side while in a flat bed without using bedrails?: A Lot Help needed moving from lying on your back to sitting on the side of a flat bed without using bedrails?: A Lot Help needed moving to and from a bed to a chair (including a wheelchair)?: A Lot Help needed standing up from a chair using your arms (e.g., wheelchair or bedside chair)?: A Lot Help needed to walk in hospital room?:  Total Help needed climbing 3-5 steps with a railing? : Total 6 Click Score: 10    End of Session   Activity Tolerance: Patient tolerated treatment well;Patient limited by fatigue;Patient limited by pain Patient left: in chair;with call bell/phone within reach;with family/visitor present Nurse Communication: Mobility status PT Visit Diagnosis: Unsteadiness on feet (R26.81);Other abnormalities of gait and mobility (R26.89);Muscle weakness (generalized) (M62.81)    Time: 5366-4403 PT Time Calculation (min) (ACUTE ONLY): 36 min   Charges:   PT Evaluation $PT Eval Moderate Complexity: 1 Mod PT Treatments $Therapeutic Activity: 23-37 mins        2:20 PM, 03/13/20 Lonell Grandchild, MPT Physical Therapist with [facility] [id number] [phone] office [phone] mobile phone

## 2020-03-13 NOTE — Progress Notes (Signed)
Patient ID: Joyce Hogan, female   DOB: [birth year], 86 y.o.   MRN: [id number]  BP (!) 126/56 (BP Location: Right Arm)   Pulse 92   Temp 100.3 F (37.9 C) (Oral)   Resp 18   Ht 5\' 1"  (1.549 m)   Wt 50.6 kg   SpO2 96%   BMI 21.08 kg/m   Postop day 1 status post bipolar hip replacement, left hip patient did well with tramadol and IV morphine  CBC Latest Ref Rng & Units 03/13/2020 03/11/2020 03/01/2020  WBC 4.0 - 10.5 K/uL 10.5 6.2 14.7(H)  Hemoglobin 12.0 - 15.0 g/dL 8.3(L) 9.6(L) 9.5(L)  Hematocrit 36 - 46 % 26.6(L) 30.5(L) 30.2(L)  Platelets 150 - 400 K/uL 231 293 279    Monitor hemoglobin and orthostatic symptoms if occur  PT today  At discharge Follow-up 2 weeks Remove staples in 2 weeks DVT prophylaxis 30 days Weight-bear as tolerated Direct lateral hip precautions

## 2020-03-14 ENCOUNTER — Inpatient Hospital Stay (HOSPITAL_COMMUNITY): Payer: Medicare Other

## 2020-03-14 ENCOUNTER — Inpatient Hospital Stay
Admission: RE | Admit: 2020-03-14 | Discharge: 2020-04-16 | Disposition: A | Discharge status: Home / Self Care | Payer: Medicare Other | Source: Ambulatory Visit | Attending: Internal Medicine | Admitting: Internal Medicine

## 2020-03-14 DIAGNOSIS — S42202D Unspecified fracture of upper end of left humerus, subsequent encounter for fracture with routine healing: Secondary | ICD-10-CM | POA: Diagnosis not present

## 2020-03-14 DIAGNOSIS — S72009A Fracture of unspecified part of neck of unspecified femur, initial encounter for closed fracture: Secondary | ICD-10-CM | POA: Diagnosis not present

## 2020-03-14 DIAGNOSIS — J9601 Acute respiratory failure with hypoxia: Secondary | ICD-10-CM | POA: Diagnosis present

## 2020-03-14 DIAGNOSIS — W19XXXA Unspecified fall, initial encounter: Secondary | ICD-10-CM | POA: Diagnosis not present

## 2020-03-14 DIAGNOSIS — S72002D Fracture of unspecified part of neck of left femur, subsequent encounter for closed fracture with routine healing: Secondary | ICD-10-CM | POA: Diagnosis not present

## 2020-03-14 DIAGNOSIS — K219 Gastro-esophageal reflux disease without esophagitis: Secondary | ICD-10-CM | POA: Diagnosis not present

## 2020-03-14 DIAGNOSIS — Z961 Presence of intraocular lens: Secondary | ICD-10-CM | POA: Diagnosis not present

## 2020-03-14 DIAGNOSIS — E876 Hypokalemia: Secondary | ICD-10-CM | POA: Diagnosis not present

## 2020-03-14 DIAGNOSIS — I480 Paroxysmal atrial fibrillation: Secondary | ICD-10-CM | POA: Diagnosis not present

## 2020-03-14 DIAGNOSIS — D649 Anemia, unspecified: Secondary | ICD-10-CM | POA: Diagnosis not present

## 2020-03-14 DIAGNOSIS — Y92009 Unspecified place in unspecified non-institutional (private) residence as the place of occurrence of the external cause: Secondary | ICD-10-CM | POA: Diagnosis not present

## 2020-03-14 DIAGNOSIS — S72002G Fracture of unspecified part of neck of left femur, subsequent encounter for closed fracture with delayed healing: Secondary | ICD-10-CM | POA: Diagnosis not present

## 2020-03-14 DIAGNOSIS — Z9181 History of falling: Secondary | ICD-10-CM | POA: Diagnosis not present

## 2020-03-14 DIAGNOSIS — M6281 Muscle weakness (generalized): Secondary | ICD-10-CM | POA: Diagnosis not present

## 2020-03-14 DIAGNOSIS — Z471 Aftercare following joint replacement surgery: Secondary | ICD-10-CM | POA: Diagnosis not present

## 2020-03-14 DIAGNOSIS — H3530 Unspecified macular degeneration: Secondary | ICD-10-CM | POA: Diagnosis not present

## 2020-03-14 DIAGNOSIS — S72002A Fracture of unspecified part of neck of left femur, initial encounter for closed fracture: Secondary | ICD-10-CM | POA: Diagnosis not present

## 2020-03-14 DIAGNOSIS — S72012A Unspecified intracapsular fracture of left femur, initial encounter for closed fracture: Secondary | ICD-10-CM | POA: Diagnosis not present

## 2020-03-14 DIAGNOSIS — Z4789 Encounter for other orthopedic aftercare: Secondary | ICD-10-CM | POA: Diagnosis not present

## 2020-03-14 DIAGNOSIS — S42292D Other displaced fracture of upper end of left humerus, subsequent encounter for fracture with routine healing: Secondary | ICD-10-CM | POA: Diagnosis not present

## 2020-03-14 DIAGNOSIS — K5909 Other constipation: Secondary | ICD-10-CM | POA: Diagnosis not present

## 2020-03-14 DIAGNOSIS — M159 Polyosteoarthritis, unspecified: Secondary | ICD-10-CM | POA: Diagnosis not present

## 2020-03-14 DIAGNOSIS — R296 Repeated falls: Secondary | ICD-10-CM | POA: Diagnosis not present

## 2020-03-14 DIAGNOSIS — Z96642 Presence of left artificial hip joint: Secondary | ICD-10-CM | POA: Diagnosis not present

## 2020-03-14 DIAGNOSIS — R262 Difficulty in walking, not elsewhere classified: Secondary | ICD-10-CM | POA: Diagnosis not present

## 2020-03-14 LAB — CBC
HCT: 22.7 % — ABNORMAL LOW (ref 36.0–46.0)
Hemoglobin: 7.2 g/dL — ABNORMAL LOW (ref 12.0–15.0)
MCH: 28.9 pg (ref 26.0–34.0)
MCHC: 31.7 g/dL (ref 30.0–36.0)
MCV: 91.2 fL (ref 80.0–100.0)
Platelets: 213 10*3/uL (ref 150–400)
RBC: 2.49 MIL/uL — ABNORMAL LOW (ref 3.87–5.11)
RDW: 14.3 % (ref 11.5–15.5)
WBC: 11.1 10*3/uL — ABNORMAL HIGH (ref 4.0–10.5)
nRBC: 0 % (ref 0.0–0.2)

## 2020-03-14 LAB — BASIC METABOLIC PANEL
Anion gap: 8 (ref 5–15)
BUN: 16 mg/dL (ref 8–23)
CO2: 24 mmol/L (ref 22–32)
Calcium: 7.4 mg/dL — ABNORMAL LOW (ref 8.9–10.3)
Chloride: 99 mmol/L (ref 98–111)
Creatinine, Ser: 0.68 mg/dL (ref 0.44–1.00)
GFR calc Af Amer: >60 mL/min (ref >60)
GFR calc non Af Amer: >60 mL/min (ref >60)
Glucose, Bld: 124 mg/dL — ABNORMAL HIGH (ref 70–99)
Potassium: 3.4 mmol/L — ABNORMAL LOW (ref 3.5–5.1)
Sodium: 131 mmol/L — ABNORMAL LOW (ref 135–145)

## 2020-03-14 LAB — SARS CORONAVIRUS 2 BY RT PCR (HOSPITAL ORDER, PERFORMED IN ~~LOC~~ HOSPITAL LAB): SARS Coronavirus 2: NEGATIVE

## 2020-03-14 LAB — PREPARE RBC (CROSSMATCH)

## 2020-03-14 MED ORDER — FUROSEMIDE 10 MG/ML IJ SOLN
20.0000 mg | Freq: Once | INTRAMUSCULAR | Status: AC
  Administered 2020-03-14: 20 mg via INTRAVENOUS
  Filled 2020-03-14: qty 2

## 2020-03-14 MED ORDER — ONDANSETRON HCL 4 MG PO TABS: 4.0000 mg | ORAL_TABLET | Freq: Four times a day (QID) | ORAL | 0 refills | Status: DC | PRN

## 2020-03-14 MED ORDER — DILTIAZEM HCL 30 MG PO TABS: 30.0000 mg | ORAL_TABLET | Freq: Two times a day (BID) | ORAL | 1 refills | Status: DC

## 2020-03-14 MED ORDER — TRAMADOL HCL 50 MG PO TABS: 50.0000 mg | ORAL_TABLET | Freq: Three times a day (TID) | ORAL | 0 refills | Status: DC | PRN

## 2020-03-14 MED ORDER — SENNOSIDES-DOCUSATE SODIUM 8.6-50 MG PO TABS: 2.0000 | ORAL_TABLET | Freq: Every day | ORAL | 1 refills | Status: AC

## 2020-03-14 MED ORDER — POTASSIUM CHLORIDE CRYS ER 20 MEQ PO TBCR
40.0000 meq | EXTENDED_RELEASE_TABLET | Freq: Once | ORAL | Status: AC
  Administered 2020-03-14: 40 meq via ORAL
  Filled 2020-03-14: qty 2

## 2020-03-14 MED ORDER — METHOCARBAMOL 500 MG PO TABS: 500.0000 mg | ORAL_TABLET | Freq: Three times a day (TID) | ORAL | 0 refills | Status: DC | PRN

## 2020-03-14 MED ORDER — TRAMADOL HCL 50 MG PO TABS
50.0000 mg | ORAL_TABLET | Freq: Two times a day (BID) | ORAL | Status: DC | PRN
  Administered 2020-03-14: 50 mg via ORAL
  Filled 2020-03-14: qty 1

## 2020-03-14 MED ORDER — ENOXAPARIN SODIUM 30 MG/0.3ML ~~LOC~~ SOLN
30.0000 mg | Freq: Every day | SUBCUTANEOUS | 0 refills | Status: DC
Start: 2020-03-14 — End: 2022-01-24

## 2020-03-14 MED ORDER — PANTOPRAZOLE SODIUM 40 MG PO TBEC: 40.0000 mg | DELAYED_RELEASE_TABLET | Freq: Every day | ORAL | 3 refills | Status: DC

## 2020-03-14 MED ORDER — PANTOPRAZOLE SODIUM 40 MG PO TBEC
40.0000 mg | DELAYED_RELEASE_TABLET | Freq: Every day | ORAL | Status: DC
  Administered 2020-03-14: 40 mg via ORAL
  Filled 2020-03-14: qty 1

## 2020-03-14 MED ORDER — SODIUM CHLORIDE 0.9% IV SOLUTION: Freq: Once | INTRAVENOUS | Status: AC

## 2020-03-14 MED ORDER — BISACODYL 10 MG RE SUPP: 10.0000 mg | RECTAL | 0 refills | Status: DC

## 2020-03-14 NOTE — TOC Transition Note (Signed)
Transition of Care [facility]) - CM/SW Discharge Note   Patient Details  Name: Joyce Hogan MRN: [id number] Date of Birth: 10/09/1926  Transition of Care [facility]) CM/SW Contact:  Sherald Barge, RN Phone Number: 03/14/2020, 10:32 AM   Clinical Narrative:   [facility] aware of DC today. RN to call report when ready.     Final next level of care: Skilled Nursing Facility Barriers to Discharge: Insurance Authorization   Patient Goals and CMS Choice Patient states their goals for this hospitalization and ongoing recovery are:: Discharge to SNF for rehab CMS Medicare.gov Compare Post Acute Care list provided to:: Patient Represenative (must comment) Tomasa Rand (niece))    Discharge Placement                       Discharge Plan and Services In-house Referral: Clinical Social Work   Post Acute Care Choice: Skilled Nursing Facility                    HH Arranged: PT, RN [facility]: [facility]        Social Determinants of Health ([place]) Interventions     Readmission Risk Interventions No flowsheet data found.

## 2020-03-14 NOTE — Progress Notes (Signed)
IV removed, 2x2 gauze and paper tape applied to site, patient tolerated well. Report called to Delphina, RN and reviewed AVS, discharge information with  who verbalized understanding.  Patient to be transported by hospital bed to [facility].

## 2020-03-14 NOTE — Discharge Summary (Addendum)
Joyce Hogan, is a 86 y.o. female  DOB 11-Mar-1927  MRN [id number].  Admission date:  03/11/2020  Admitting Physician  Elliot Meldrum Denton Brick, MD  Discharge Date:  03/14/2020   Primary MD  Neale Burly, MD  Recommendations for primary care physician for things to follow:   1)Avoid ibuprofen/Advil/Aleve/Motrin/Goody Powders/Naproxen/BC powders/Meloxicam/Diclofenac/Indomethacin and other Nonsteroidal anti-inflammatory medications as these will make you more likely to bleed and can cause stomach ulcers, can also cause Kidney problems.   2) status post left hip surgery --- - postop recommendations are as follows  -follow-up 2 weeks Remove staples in 2 weeks DVT prophylaxis 30 days with Lovenox 30 mg subcu daily Weight-bear as tolerated Direct lateral hip precautions  3) CBC every Monday for 2 weeks starting Monday, 03/16/2020  4)Please crush all pills in applesauce or pudding  Admission Diagnosis  Hip fracture ([place]) [S72.009A] Fall [W19.XXXA] Fall, initial encounter [id number]] Closed left hip fracture, initial encounter ([place]) [S72.002A]   Discharge Diagnosis  Hip fracture ([place]) [S72.009A] Fall [W19.XXXA] Fall, initial encounter [id number]] Closed left hip fracture, initial encounter ([place]) [S72.002A]    Principal Problem:   Hip fracture ([place]) Active Problems:   Acute on chronic anemia   Acute respiratory failure with hypoxia ([place])   Fall at home, initial encounter      History reviewed. No pertinent past medical history.  Past Surgical History:  Procedure Laterality Date  . ABDOMINAL HYSTERECTOMY    . CATARACT EXTRACTION W/PHACO Right 01/04/2016   Procedure: CATARACT EXTRACTION PHACO AND INTRAOCULAR LENS PLACEMENT (IOC);  Surgeon: Tonny Branch, MD;  Location: [facility];  Service: Ophthalmology;  Laterality: Right;  CDE 9.06  . CATARACT EXTRACTION W/PHACO Left 01/21/2016   Procedure: CATARACT EXTRACTION  PHACO AND INTRAOCULAR LENS PLACEMENT (IOC);  Surgeon: Tonny Branch, MD;  Location: [facility];  Service: Ophthalmology;  Laterality: Left;  CDE: 12.65  . HIP ARTHROPLASTY Left 03/12/2020   Procedure: ARTHROPLASTY BIPOLAR HIP (HEMIARTHROPLASTY);  Surgeon: Carole Civil, MD;  Location: [facility];  Service: Orthopedics;  Laterality: Left;  . TONSILLECTOMY       HPI  from the history and physical done on the day of admission:     Joyce Hogan  is a 86 y.o. female with history of paroxysmal atrial fibrillation and osteoarthritis presents from home after mechanical fall landing on the left side with left hip fracture -No syncope, no chest pain no palpitation no dizziness -Patient apparently was ambulating at home without her walker and had a mechanical fall - -Left hip and pelvic x-rays consistent with left hip fracture -Chest x-ray without acute findings -CBC demonstrates chronic anemia which is at patient's baseline -BMP without acute abnormalities -Additional history obtained from patient's daughter at bedside -Apparently she is not on anticoagulation for a paroxysmal A. fib, she takes Cardizem every now and then for rate control  No Nausea, Vomiting or Diarrhea  No fever  Or chills No chest pains palpitations or dizziness  --Additional history obtained from patient's daughter at bedside   Hospital Course:    1))Left  Hip Fracture---status post mechanical fall,orthopedic consult appreciated s/p operative treatment on 03/12/2020 - postop recommendations are as follows  -follow-up 2 weeks Remove staples in 2 weeks DVT prophylaxis 30 days with Lovenox 30 mg subcu daily Weight-bear as tolerated Direct lateral hip precautions -Further management per orthopedic surgeon  2)-History of paroxysmal atrial fibrillation--  -EF is preserved at 60 to 65% as per echo, no evidence of diastolic dysfunction on echocardiogram  --okay to continue Cardizem at 30 mg twice daily -PTA was not on  anticoagulation, -TSH is 0.3 -Consider repeat TSH in 6 to 8 weeks  - 3)-Suspect acute on chronic anemia secondary to perioperative acute blood loss--- --at baseline patient has chronic normocytic and normochromic anemia---etiology unclear at this time, hemoglobin  down to 7.2 -Suspect acute blood loss anemia secondary to hip fracture on perioperative blood loss superimposed on patient's baseline chronic anemia -Patient with fatigue, hypoxia and difficulty participating in rehab will transfuse 1 unit of PRBC due to symptomatic anemia -Repeat CBC SNF on 03/16/2020 -No evidence of ongoing bleeding at this time  4)recent left humerus fracture---apparently healing nicely -Continue physical therapy at facility, to continue to use left arm sling  5) acute hypoxic respiratory failure in the postop patient--- continue supplemental oxygen for now -Patient to be reevaluated prior to discharge from SNF rehab to see if she will continue to require oxygen  Disposition--- discharge to SNF rehab     Dispo: The patient is from:Home Anticipated d/c is to:SNF Patient currently is  medically stable to d/c.  Code Status : full  Family Communication:   (patient is alert, awake and coherent)  Discussed with Niece/caregiver at bedside  Consults  :   Orthopedic surgeon  DVT Prophylaxis  :    -Lovenox 30 mg daily for 30 days for post hip surgery DVT prophylaxis  Discharge Condition: stable  Follow UP   Contact information for after-discharge care    [facility] .   Service: Skilled Nursing Contact information: [address] [address] [phone]                  Diet and Activity recommendation:  As advised  Discharge Instructions    Discharge Instructions    Call MD for:  difficulty breathing, headache or visual disturbances   Complete by: As directed    Call MD  for:  extreme fatigue   Complete by: As directed    Call MD for:  persistant dizziness or light-headedness   Complete by: As directed    Call MD for:  persistant nausea and vomiting   Complete by: As directed    Call MD for:  severe uncontrolled pain   Complete by: As directed    Call MD for:  temperature >100.4   Complete by: As directed    Diet - low sodium heart healthy   Complete by: As directed    Please crush all pills in applesauce or pudding   Discharge instructions   Complete by: As directed    1)Avoid ibuprofen/Advil/Aleve/Motrin/Goody Powders/Naproxen/BC powders/Meloxicam/Diclofenac/Indomethacin and other Nonsteroidal anti-inflammatory medications as these will make you more likely to bleed and can cause stomach ulcers, can also cause Kidney problems.    2) status post left hip surgery --- postop recommendations are as follows  -follow-up 2 weeks Remove staples in 2 weeks DVT prophylaxis 30 days with Lovenox 30 mg subcu daily Weight-bear as tolerated Direct lateral hip precautions  3) CBC every  Monday for 2 weeks starting Monday, 03/16/2020  4)Please crush all pills in applesauce or pudding   Discharge wound care:   Complete by: As directed    Dry dressing as advised by orthopedic surgeon   Walk with assistance   Complete by: As directed        Discharge Medications     Allergies as of 03/14/2020      Reactions   Penicillins    unknown      Medication List    STOP taking these medications   ciprofloxacin 500 MG tablet Commonly known as: Cipro     TAKE these medications   acetaminophen 325 MG tablet Commonly known as: TYLENOL Take 650 mg by mouth every 6 (six) hours as needed for mild pain or moderate pain. ALL PILLS ARE CRUSHED-MIXED IN APPLESAUCE   bisacodyl 10 MG suppository Commonly known as: Dulcolax Place 1 suppository (10 mg total) rectally See admin instructions. Every Monday and Friday only   diltiazem 30 MG tablet Commonly known as:  CARDIZEM Take 1 tablet (30 mg total) by mouth 2 (two) times daily. ALL PILLS ARE CRUSHED-MIXED IN APPLESAUCE What changed: when to take this   enoxaparin 30 MG/0.3ML injection Commonly known as: Lovenox Inject 0.3 mLs (30 mg total) into the skin daily. For post left hip surgery DVT prophylaxis   methocarbamol 500 MG tablet Commonly known as: ROBAXIN Take 1 tablet (500 mg total) by mouth every 8 (eight) hours as needed for muscle spasms.   ondansetron 4 MG tablet Commonly known as: ZOFRAN Take 1 tablet (4 mg total) by mouth every 6 (six) hours as needed for nausea.   pantoprazole 40 MG tablet Commonly known as: PROTONIX Take 1 tablet (40 mg total) by mouth daily. Start taking on: March 15, 2020   potassium chloride 20 MEQ/15ML (10%) Soln Take 20 mEq by mouth daily.   PreserVision AREDS 2 Chew Chew 1 tablet by mouth in the morning and at bedtime.   senna-docusate 8.6-50 MG tablet Commonly known as: Senokot-S Take 2 tablets by mouth at bedtime.   traMADol 50 MG tablet Commonly known as: ULTRAM Take 1 tablet (50 mg total) by mouth every 8 (eight) hours as needed for moderate pain or severe pain.   Viactiv Calcium Plus D 650-12.5-40 MG-MCG-MCG Chew Generic drug: Calcium-Vitamin D-Vitamin K Chew 1 each by mouth in the morning and at bedtime.            Discharge Care Instructions  (From admission, onward)         Start     Ordered   03/14/20 0000  Discharge wound care:       Comments: Dry dressing as advised by orthopedic surgeon   03/14/20 1158          Major procedures and Radiology Reports - PLEASE review detailed and final reports for all details, in brief -   DG Chest 1 View  Result Date: 03/11/2020 CLINICAL DATA:  Fall, left hip fracture EXAM: CHEST  1 VIEW COMPARISON:  02/13/2020 FINDINGS: Stable heart size and vascularity. No significant focal pneumonia, collapse or consolidation. Negative for edema, effusion or pneumothorax. Aorta atherosclerotic and  tortuous. Old left proximal humerus surgical neck fracture with foreshortening and impaction. IMPRESSION: No active chest disease Old left proximal humerus surgical neck fracture Aortic Atherosclerosis (ICD10-I70.0). Electronically Signed   By: Jerilynn Mages.  Shick M.D.   On: 03/11/2020 09:18   DG Pelvis 1-2 Views  Result Date: 03/11/2020 CLINICAL DATA:  Fall, left hip  pain EXAM: PELVIS - 1-2 VIEW COMPARISON:  03/11/2020 FINDINGS: There is an acute displaced left hip subcapital femoral neck fracture. Bony pelvis and right hip appear intact. Bones are osteopenic. Aortoiliac atherosclerosis evident. Nonobstructive bowel gas pattern. IMPRESSION: Acute left hip subcapital femoral neck fracture. Osteopenia Aortic Atherosclerosis (ICD10-I70.0). Electronically Signed   By: Jerilynn Mages.  Shick M.D.   On: 03/11/2020 09:19   DG Pelvis Portable  Result Date: 03/12/2020 CLINICAL DATA:  Left hip replacement. EXAM: PORTABLE PELVIS 1-2 VIEWS COMPARISON:  03/11/2020. FINDINGS: Diffuse osteopenia. Degenerative change right hip. Left hip replacement. Hardware intact. Anatomic alignment. Pelvic calcifications consistent phleboliths. Left inguinal hernia appears to be present. IMPRESSION: 1.  Left inguinal hernia appears to be present. 2.  Left hip replacement.  No acute bony abnormality. 3.  Diffuse osteopenia.  Degenerative change right hip. Electronically Signed   By: Marcello Moores  Register   On: 03/12/2020 12:05   ECHOCARDIOGRAM COMPLETE  Result Date: 03/13/2020    ECHOCARDIOGRAM REPORT   Patient Name:   TAMICA COVELL Date of Exam: 03/13/2020 Medical Rec #:  [id number]   Height:       61.0 in Accession #:    [id number]  Weight:       111.6 lb Date of Birth:  1927-07-22   BSA:          1.474 m Patient Age:    23 years    BP:           100/42 mmHg Patient Gender: F           HR:           84 bpm. Exam Location:  [facility] Procedure: 2D Echo Indications:    Abnormal ECG 794.31 / R94.31  History:        Patient has no prior history of Echocardiogram  examinations.                 Arrythmias:Atrial Fibrillation; Risk Factors:Non-Smoker. Hip                 Fracture.  Sonographer:    Leavy Cella RDCS (AE) Referring Phys: [id number] [place] IMPRESSIONS  1. Left ventricular ejection fraction, by estimation, is 60 to 65%. The left ventricle has normal function. The left ventricle has no regional wall motion abnormalities. Left ventricular diastolic parameters were normal.  2. Right ventricular systolic function is normal. The right ventricular size is normal. There is moderately elevated pulmonary artery systolic pressure.  3. The mitral valve is abnormal. Mild mitral valve regurgitation.  4. The aortic valve is abnormal. Aortic valve regurgitation is mild. Mild aortic valve sclerosis is present, with no evidence of aortic valve stenosis.  5. The inferior vena cava is normal in size with greater than 50% respiratory variability, suggesting right atrial pressure of 3 mmHg. FINDINGS  Left Ventricle: Left ventricular ejection fraction, by estimation, is 60 to 65%. The left ventricle has normal function. The left ventricle has no regional wall motion abnormalities. The left ventricular internal cavity size was normal in size. There is  no left ventricular hypertrophy. Left ventricular diastolic parameters were normal. Right Ventricle: The right ventricular size is normal. Right vetricular wall thickness was not assessed. Right ventricular systolic function is normal. There is moderately elevated pulmonary artery systolic pressure. The tricuspid regurgitant velocity is  3.41 m/s, and with an assumed right atrial pressure of 10 mmHg, the estimated right ventricular systolic pressure is 43.1 mmHg. Left Atrium: Left atrial size was normal in size. Right Atrium:  Right atrial size was normal in size. Pericardium: There is no evidence of pericardial effusion. Mitral Valve: The mitral valve is abnormal. There is mild thickening of the mitral valve leaflet(s). Mild mitral  valve regurgitation. Tricuspid Valve: The tricuspid valve is normal in structure. Tricuspid valve regurgitation is mild. Aortic Valve: The aortic valve is abnormal. Aortic valve regurgitation is mild. Aortic regurgitation PHT measures 509 msec. Mild aortic valve sclerosis is present, with no evidence of aortic valve stenosis. Pulmonic Valve: The pulmonic valve was not well visualized. Pulmonic valve regurgitation is not visualized. Aorta: The aortic root is normal in size and structure. Venous: The inferior vena cava is normal in size with greater than 50% respiratory variability, suggesting right atrial pressure of 3 mmHg. IAS/Shunts: No atrial level shunt detected by color flow Doppler.  LEFT VENTRICLE PLAX 2D LVIDd:         4.21 cm  Diastology LVIDs:         3.30 cm  LV e' lateral:   12.40 cm/s LV PW:         0.98 cm  LV E/e' lateral: 7.2 LV IVS:        1.05 cm  LV e' medial:    8.05 cm/s LVOT diam:     1.90 cm  LV E/e' medial:  11.0 LVOT Area:     2.84 cm  RIGHT VENTRICLE RV S prime:     15.20 cm/s LEFT ATRIUM             Index       RIGHT ATRIUM           Index LA diam:        3.90 cm 2.65 cm/m  RA Area:     11.90 cm LA Vol (A2C):   42.6 ml 28.91 ml/m RA Volume:   26.40 ml  17.91 ml/m LA Vol (A4C):   38.0 ml 25.78 ml/m LA Biplane Vol: 40.3 ml 27.35 ml/m  AORTIC VALVE AI PHT:      509 msec  AORTA Ao Root diam: 2.80 cm MITRAL VALVE               TRICUSPID VALVE MV Area (PHT): 3.72 cm    TR Peak grad:   46.5 mmHg MV Decel Time: 204 msec    TR Vmax:        341.00 cm/s MR Peak grad: 91.4 mmHg MR Mean grad: 68.0 mmHg    SHUNTS MR Vmax:      478.00 cm/s  Systemic Diam: 1.90 cm MR Vmean:     396.0 cm/s MV E velocity: 88.80 cm/s MV A velocity: 70.30 cm/s MV E/A ratio:  1.26 Dorris Carnes MD Electronically signed by Dorris Carnes MD Signature Date/Time: 03/13/2020/5:05:57 PM    Final    CT Renal Stone Study  Result Date: 03/01/2020 CLINICAL DATA:  Right flank pain for 2 days EXAM: CT ABDOMEN AND PELVIS WITHOUT  CONTRAST TECHNIQUE: Multidetector CT imaging of the abdomen and pelvis was performed following the standard protocol without IV contrast. COMPARISON:  Chest radiograph 12/08/2019, 02/13/2020 FINDINGS: Lower chest: Bibasilar airways thickening and diffuse secretions with more mixed ground-glass and consolidative opacity in the right lower lobe as well as a more consolidative opacity in the periphery of the left lung base as well which could reflect further infection or round atelectasis though an underlying pleural abnormality is not well demonstrated. Mild cardiomegaly. Trace pericardial effusion. Dense calcifications of the included thoracic aorta and aortic valve leaflets as well  as the coronary arteries. Hepatobiliary: Smooth liver surface contour. No visible focal liver lesion. Gallbladder demonstrates multiple prominent fold but without pericholecystic inflammation. No visible calcified intraductal gallstones or biliary dilatation. Pancreas: Unremarkable. No pancreatic ductal dilatation or surrounding inflammatory changes. Spleen: Normal in size without focal abnormality. Adrenals/Urinary Tract: Normal adrenal glands 4 cm fluid attenuation cyst seen in the lower pole left kidney. Additional partially exophytic 1.3 cm. Indeterminate intermediate attenuation cyst measuring 31 HU seen arising from the posterior interpolar left kidney (2/29). Incompletely characterized on this exam. Several smaller subcentimeter hyperattenuating foci are present as well presumably hyperdense cysts (2/26-27). Additional subcentimeter hypoattenuating foci in the kidneys too small to fully characterize on CT imaging but statistically likely benign. Nonobstructing calculi present in the lower poles both kidneys. No visible obstructive urolithiasis along the course of either ureter. Extensive pelvic phleboliths are noted. Circumferential bladder wall thickening, greater than expected for underdistention. Stomach/Bowel: Small hiatal  hernia. Distal stomach is unremarkable. Large air filled duodenal diverticulum (5/40) measuring up to 5 cm in size. Duodenum otherwise unremarkable normally crossing the midline abdomen. No small bowel dilatation or wall thickening. Appendix is not visualized. No focal inflammation the vicinity of the cecum to suggest an occult appendicitis. Moderate colonic stool burden. Portion of the sigmoid colon protruding into a left inguinal hernia without evidence of mechanical bowel obstruction or vascular compromise. Large inspissated rectal stool ball mural thickening and presacral fat stranding measuring up to 8.5 cm in maximal diameter (2/65). Vascular/Lymphatic: Atherosclerotic calcifications throughout the abdominal aorta and branch vessels. No aneurysm or ectasia. No enlarged abdominopelvic lymph nodes. Reproductive: Uterus is surgically absent. 3.6 cm cyst seen in the left ovary. The right ovarian tissue is poorly visualized. Other: Fat and bowel containing left groin hernia. No abdominopelvic free air or fluid. Presacral fat stranding, as above. Musculoskeletal: Multilevel degenerative changes are present in the imaged portions of the spine. Likely remote compression deformity of T12 with approximately 40% height loss. Likely vertebral body hemangiomata at the L2 and 3 levels. Levocurvature of the lumbar spine, apex L3-4. Minimal compensatory dextrocurvature of the lower thoracic spine. Asymmetric sclerosis of the right SI joint may reflect sequela of prior sacroiliitis. IMPRESSION: 1. Large inspissated rectal stool ball with mural thickening and presacral fat stranding measuring up to 8.5 cm in maximal diameter. Findings are concerning for stercoral colitis. 2. Fat and sigmoid colon containing left groin hernia without evidence of vascular compromise or mechanical obstruction. 3. Circumferential bladder wall thickening, greater than expected for underdistention. Recommend correlation with urinalysis to exclude  cystitis. 4. Bibasilar airways thickening and diffuse secretions with more mixed ground-glass and consolidative opacity in the right lower lobe as well as a more consolidative opacity in the periphery of the left lung base as well which could reflect further infection or round atelectasis though an underlying pleural abnormality is not well demonstrated. Recommend follow-up chest CT in 6-8 weeks following appropriate therapy to ensure resolution. 5. Nonobstructing bilateral nephrolithiasis. No visible obstructive urolithiasis along the course of either ureter. 6. Indeterminate intermediate attenuation cyst in the posterior left interpolar kidney. Consider outpatient evaluation with nonemergent renal ultrasound. 7. 3.6 cm left ovarian cyst. Recommend further evaluation with pelvic ultrasound on a nonemergent basis. This recommendation follows ACR consensus guidelines: White Paper of the ACR Incidental Findings Committee II on Adnexal Findings. J Am Coll Radiol 2013:10:675-681. 8. Likely remote compression deformity of T12 with approximately 40% height loss. 9. 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 10. Asymmetric sclerosis of the right  SI joint may reflect sequela of prior sacroiliitis though could correlate for acute symptoms 11. Aortic Atherosclerosis (ICD10-I70.0). Electronically Signed   By: Lovena Le M.D.   On: 03/01/2020 23:12   DG FEMUR MIN 2 VIEWS LEFT  Result Date: 03/11/2020 CLINICAL DATA:  Fall, left hip fracture EXAM: LEFT FEMUR 2 VIEWS COMPARISON:  03/11/2020 FINDINGS: There is an acute left hip subcapital femoral neck fracture with displacement and slight foreshortening. Bones are osteopenic. Distal femur intact at the knee. Femoral and popliteal peripheral atherosclerosis evident. No associated subluxation or dislocation. IMPRESSION: Acute left hip subcapital femoral neck fracture. Osteopenia Electronically Signed   By: Jerilynn Mages.  Shick M.D.   On: 03/11/2020 09:21    Micro Results   Recent  Results (from the past 240 hour(s))  SARS Coronavirus 2 by RT PCR (hospital order, performed in [facility] hospital lab) Nasopharyngeal Nasopharyngeal Swab     Status: None   Collection Time: 03/11/20  8:20 AM   Specimen: Nasopharyngeal Swab  Result Value Ref Range Status   SARS Coronavirus 2 NEGATIVE NEGATIVE Final    Comment: (NOTE) SARS-CoV-2 target nucleic acids are NOT DETECTED.  The SARS-CoV-2 RNA is generally detectable in upper and lower respiratory specimens during the acute phase of infection. The lowest concentration of SARS-CoV-2 viral copies this assay can detect is 250 copies / mL. A negative result does not preclude SARS-CoV-2 infection and should not be used as the sole basis for treatment or other patient management decisions.  A negative result may occur with improper specimen collection / handling, submission of specimen other than nasopharyngeal swab, presence of viral mutation(s) within the areas targeted by this assay, and inadequate number of viral copies (<250 copies / mL). A negative result must be combined with clinical observations, patient history, and epidemiological information.  Fact Sheet for Patients:   StrictlyIdeas.no  Fact Sheet for Healthcare Providers: BankingDealers.co.za  This test is not yet approved or  cleared by the [place] FDA and has been authorized for detection and/or diagnosis of SARS-CoV-2 by FDA under an Emergency Use Authorization (EUA).  This EUA will remain in effect (meaning this test can be used) for the duration of the COVID-19 declaration under Section 564(b)(1) of the Act, 21 U.S.C. section 360bbb-3(b)(1), unless the authorization is terminated or revoked sooner.  Performed at [facility], [address] [place], [place] [address]   Surgical PCR screen     Status: None   Collection Time: 03/11/20  8:45 PM   Specimen: Nasal Mucosa; Nasal Swab  Result Value Ref Range  Status   MRSA, PCR NEGATIVE NEGATIVE Final   Staphylococcus aureus NEGATIVE NEGATIVE Final    Comment: (NOTE) The Xpert SA Assay (FDA approved for NASAL specimens in patients 18 years of age and older), is one component of a comprehensive surveillance program. It is not intended to diagnose infection nor to guide or monitor treatment. Performed at [facility], [address], [place], [place] [address]   SARS Coronavirus 2 by RT PCR (hospital order, performed in [facility] hospital lab) Nasopharyngeal Nasopharyngeal Swab     Status: None   Collection Time: 03/14/20 10:40 AM   Specimen: Nasopharyngeal Swab  Result Value Ref Range Status   SARS Coronavirus 2 NEGATIVE NEGATIVE Final    Comment: (NOTE) SARS-CoV-2 target nucleic acids are NOT DETECTED.  The SARS-CoV-2 RNA is generally detectable in upper and lower respiratory specimens during the acute phase of infection. The lowest concentration of SARS-CoV-2 viral copies this assay can  detect is 250 copies / mL. A negative result does not preclude SARS-CoV-2 infection and should not be used as the sole basis for treatment or other patient management decisions.  A negative result may occur with improper specimen collection / handling, submission of specimen other than nasopharyngeal swab, presence of viral mutation(s) within the areas targeted by this assay, and inadequate number of viral copies (<250 copies / mL). A negative result must be combined with clinical observations, patient history, and epidemiological information.  Fact Sheet for Patients:   StrictlyIdeas.no  Fact Sheet for Healthcare Providers: BankingDealers.co.za  This test is not yet approved or  cleared by the [place] FDA and has been authorized for detection and/or diagnosis of SARS-CoV-2 by FDA under an Emergency Use Authorization (EUA).  This EUA will remain in effect (meaning this test can be used) for the  duration of the COVID-19 declaration under Section 564(b)(1) of the Act, 21 U.S.C. section 360bbb-3(b)(1), unless the authorization is terminated or revoked sooner.  Performed at [facility], [address], [place], [place] [address]    Today   Subjective    Joany Khatib today has fatigue, poor endurance due to symptomatic anemia - -Hypoxia noted  Patient has been seen and examined prior to discharge   Objective   Blood pressure (!) 100/43, pulse 77, temperature 98.6 F (37 C), temperature source Oral, resp. rate 18, height 5\' 1"  (1.549 m), weight 50.6 kg, SpO2 96 %.   Intake/Output Summary (Last 24 hours) at 03/14/2020 1233 Last data filed at 03/14/2020 0900 Gross per 24 hour  Intake 927.3 ml  Output 600 ml  Net 327.3 ml   Exam Gen:- Awake Alert,  In no apparent distress  HEENT:- [place].AT, No sclera icterus Nose- [place] 2L/min Neck-Supple Neck,No JVD,.  Lungs-  CTAB , fair symmetrical air movement CV- S1, S2 normal, irregular  Abd-  +ve B.Sounds, Abd Soft, No tenderness,    Extremity/Skin:- No  edema, pedal pulses present  Psych-affect is appropriate, oriented x3 Neuro-generalized weakness no new focal deficits, no tremors MSK-left hip with appropriate postop tenderness   Data Review   CBC w Diff:  Lab Results  Component Value Date   WBC 11.1 (H) 03/14/2020   HGB 7.2 (L) 03/14/2020   HCT 22.7 (L) 03/14/2020   PLT 213 03/14/2020   LYMPHOPCT 27 03/11/2020   MONOPCT 12 03/11/2020   EOSPCT 3 03/11/2020   BASOPCT 0 03/11/2020   CMP:  Lab Results  Component Value Date   NA 131 (L) 03/14/2020   K 3.4 (L) 03/14/2020   CL 99 03/14/2020   CO2 24 03/14/2020   BUN 16 03/14/2020   CREATININE 0.68 03/14/2020   PROT 6.0 (L) 03/01/2020   ALBUMIN 2.6 (L) 03/01/2020   BILITOT 0.7 03/01/2020   ALKPHOS 62 03/01/2020   AST 14 (L) 03/01/2020   ALT 11 03/01/2020  .  Total Discharge time is about 33 minutes  Roxan Hockey M.D on 03/14/2020 at 12:33 PM  Go to  [email or web address] -  for contact info  [facility] - Office  [phone]

## 2020-03-15 LAB — TYPE AND SCREEN
ABO/RH(D): A POS
ABO/RH(D): A POS
Antibody Screen: NEGATIVE
Antibody Screen: NEGATIVE
Unit division: 0
Unit division: 0
Unit division: 0

## 2020-03-15 LAB — BPAM RBC
Blood Product Expiration Date: 202107142359
Blood Product Expiration Date: 202107142359
Blood Product Expiration Date: 202107062359
ISSUE DATE / TIME: 202106191157
Unit Type and Rh: 6200
Unit Type and Rh: 6200
Unit Type and Rh: 6200

## 2020-03-16 ENCOUNTER — Non-Acute Institutional Stay (SKILLED_NURSING_FACILITY): Payer: Medicare Other | Admitting: Adult Health

## 2020-03-16 ENCOUNTER — Encounter: Payer: Self-pay | Admitting: Adult Health

## 2020-03-16 ENCOUNTER — Other Ambulatory Visit (HOSPITAL_COMMUNITY)
Admission: RE | Admit: 2020-03-16 | Discharge: 2020-03-16 | Disposition: A | Discharge status: Home / Self Care | Payer: Medicare Other | Source: Skilled Nursing Facility | Attending: Internal Medicine | Admitting: Internal Medicine

## 2020-03-16 DIAGNOSIS — D649 Anemia, unspecified: Secondary | ICD-10-CM

## 2020-03-16 DIAGNOSIS — S72002G Fracture of unspecified part of neck of left femur, subsequent encounter for closed fracture with delayed healing: Secondary | ICD-10-CM

## 2020-03-16 DIAGNOSIS — K5909 Other constipation: Secondary | ICD-10-CM

## 2020-03-16 DIAGNOSIS — K219 Gastro-esophageal reflux disease without esophagitis: Secondary | ICD-10-CM | POA: Diagnosis not present

## 2020-03-16 DIAGNOSIS — I480 Paroxysmal atrial fibrillation: Secondary | ICD-10-CM | POA: Diagnosis not present

## 2020-03-16 DIAGNOSIS — E876 Hypokalemia: Secondary | ICD-10-CM

## 2020-03-16 LAB — CBC
HCT: 26.9 % — ABNORMAL LOW (ref 36.0–46.0)
Hemoglobin: 8.5 g/dL — ABNORMAL LOW (ref 12.0–15.0)
MCH: 28.5 pg (ref 26.0–34.0)
MCHC: 31.6 g/dL (ref 30.0–36.0)
MCV: 90.3 fL (ref 80.0–100.0)
Platelets: 297 10*3/uL (ref 150–400)
RBC: 2.98 MIL/uL — ABNORMAL LOW (ref 3.87–5.11)
RDW: 14.8 % (ref 11.5–15.5)
WBC: 7.3 10*3/uL (ref 4.0–10.5)
nRBC: 0 % (ref 0.0–0.2)

## 2020-03-16 NOTE — Progress Notes (Signed)
Location:    [facility] Room Number: 157/W Place of Service:  SNF (31)   CODE STATUS: DNR  Allergies  Allergen Reactions  . Penicillins     unknown    Chief Complaint  Patient presents with  . Hospitalization Follow-up    Hospitalization Follow Up    HPI:  She is a 86 year old woman who has been hospitalized from 03-11-20 through 03-14-20. She had a mechanical fall at home and suffered a left hip fracture. She had a left hemiarthroplasty on 03-12-20. she is here for short term rehab with her goal to return back home. She denies any pain; no constipation; no anxiety no insomnia. She will continue to be followed for her chronic illnesses including: gerd anemia afib.    Past Medical History:  Diagnosis Date  . A-fib ([employer])   . Arthritis   . Chronic anemia   . GERD (gastroesophageal reflux disease)   . PAF (paroxysmal atrial fibrillation) (HCC)    No anticoagulation due to chronic anemia     Past Surgical History:  Procedure Laterality Date  . ABDOMINAL HYSTERECTOMY    . CATARACT EXTRACTION W/PHACO Right 01/04/2016   Procedure: CATARACT EXTRACTION PHACO AND INTRAOCULAR LENS PLACEMENT (IOC);  Surgeon: Tonny Branch, MD;  Location: [facility];  Service: Ophthalmology;  Laterality: Right;  CDE 9.06  . CATARACT EXTRACTION W/PHACO Left 01/21/2016   Procedure: CATARACT EXTRACTION PHACO AND INTRAOCULAR LENS PLACEMENT (IOC);  Surgeon: Tonny Branch, MD;  Location: [facility];  Service: Ophthalmology;  Laterality: Left;  CDE: 12.65  . HIP ARTHROPLASTY Left 03/12/2020   Procedure: ARTHROPLASTY BIPOLAR HIP (HEMIARTHROPLASTY);  Surgeon: Carole Civil, MD;  Location: [facility];  Service: Orthopedics;  Laterality: Left;  . TONSILLECTOMY      Social History   Socioeconomic History  . Marital status: Widowed    Spouse name: Not on file  . Number of children: Not on file  . Years of education: Not on file  . Highest education level: Not on file  Occupational History  . Not on  file  Tobacco Use  . Smoking status: Never Smoker  . Smokeless tobacco: Never Used  Substance and Sexual Activity  . Alcohol use: No  . Drug use: No  . Sexual activity: Never    Birth control/protection: Surgical  Other Topics Concern  . Not on file  Social History Narrative  . Not on file   Social Determinants of Health   Financial Resource Strain:   . Difficulty of Paying Living Expenses:   Food Insecurity:   . Worried About Charity fundraiser in the Last Year:   . Arboriculturist in the Last Year:   Transportation Needs:   . Film/video editor (Medical):   Marland Kitchen Lack of Transportation (Non-Medical):   Physical Activity:   . Days of Exercise per Week:   . Minutes of Exercise per Session:   Stress:   . Feeling of Stress :   Social Connections:   . Frequency of Communication with Friends and Family:   . Frequency of Social Gatherings with Friends and Family:   . Attends Religious Services:   . Active Member of Clubs or Organizations:   . Attends Archivist Meetings:   Marland Kitchen Marital Status:   Intimate Partner Violence:   . Fear of Current or Ex-Partner:   . Emotionally Abused:   Marland Kitchen Physically Abused:   . Sexually Abused:    Family History  Problem Relation  Age of Onset  . Dementia Mother       VITAL SIGNS BP (!) 100/43   Pulse 77   Temp 98.6 F (37 C) (Oral)   Resp 18   Ht 5\' 1"  (1.549 m)   Wt 111 lb 8.8 oz (50.6 kg)   SpO2 96%   BMI 21.08 kg/m   Outpatient Encounter Medications as of 03/16/2020  Medication Sig  . acetaminophen (TYLENOL) 325 MG tablet Take 650 mg by mouth every 6 (six) hours as needed for mild pain or moderate pain. ALL PILLS ARE CRUSHED-MIXED IN APPLESAUCE  . Balsam [place]-Castor Oil (VENELEX) OINT Apply topically. amt: 1 aplication; topical Special Instructions: Apply to sacrum and bilateral buttocks q shift for prevention. Every Shift Day, Evening, Night  . bisacodyl (DULCOLAX) 10 MG suppository Place 1 suppository (10 mg  total) rectally See admin instructions. Every Monday and Friday only  . Calcium-Vitamin D-Vitamin K (VIACTIV CALCIUM PLUS D) 650-12.5-40 MG-MCG-MCG CHEW Chew 1 each by mouth in the morning and at bedtime.  Marland Kitchen diltiazem (CARDIZEM) 30 MG tablet Take 1 tablet (30 mg total) by mouth 2 (two) times daily. ALL PILLS ARE CRUSHED-MIXED IN APPLESAUCE  . enoxaparin (LOVENOX) 30 MG/0.3ML injection Inject 0.3 mLs (30 mg total) into the skin daily. For post left hip surgery DVT prophylaxis  . methocarbamol (ROBAXIN) 500 MG tablet Take 1 tablet (500 mg total) by mouth every 8 (eight) hours as needed for muscle spasms.  . Multiple Vitamins-Minerals (PRESERVISION AREDS 2) CHEW Chew 1 tablet by mouth in the morning and at bedtime.  . NON FORMULARY Diet: __x___ Regular, ______ NAS, _______Consistent Carbohydrate, _______NPO _____Other  . NON FORMULARY Diet - Liquids: __X_Regular; ___Thickened ___ Consistency: ___ Nectar, ___Honey, ___ Pudding; ____ Fluid Restriction  . ondansetron (ZOFRAN) 4 MG tablet Take 1 tablet (4 mg total) by mouth every 6 (six) hours as needed for nausea.  . pantoprazole (PROTONIX) 40 MG tablet Take 1 tablet (40 mg total) by mouth daily.  . potassium chloride 20 MEQ/15ML (10%) SOLN Take 20 mEq by mouth daily.  Marland Kitchen senna-docusate (SENOKOT-S) 8.6-50 MG tablet Take 2 tablets by mouth at bedtime.  . traMADol (ULTRAM) 50 MG tablet Take 1 tablet (50 mg total) by mouth every 8 (eight) hours as needed for moderate pain or severe pain.   No facility-administered encounter medications on file as of 03/16/2020.     SIGNIFICANT DIAGNOSTIC EXAMS  TODAY  03-11-20: Chest x-ray: No active chest disease  Old left proximal humerus surgical neck fracture Aortic Atherosclerosis  03-11-20: left femur x-ray: Acute left hip subcapital femoral neck fracture. Osteopenia  03-13-20: 2-d echo:  Left ventricular ejection fraction, by estimation, is 60 to 65%. The left ventricle has normal function. The left ventricle  has no regional  wall motion abnormalities. Left ventricular diastolic parameters were normal.   LABS REVIEWED TODAY;   03-11-20: wbc 6.2; hgb 9.6; hct 30.5 mcv 91.9 plt 293; glucose 116; bun 18; creat 0.71; k+ 3.9; na++ 137; ca 8.8 tsh 0.298 03-14-20: wbc 11.1; hgb 7.2; hct 22.7; mcv 91.2 plt 213;glucose 124; bun 16; creat 0.68; k+ 3.4; na++ 131; ca 7.4 03-15-20: wbc 7.3; hgb 8.5; hct 26.9; mcv 90.3 plt 297   Review of Systems  Constitutional: Negative for malaise/fatigue.  Respiratory: Negative for cough and shortness of breath.   Cardiovascular: Negative for chest pain, palpitations and leg swelling.  Gastrointestinal: Negative for abdominal pain, constipation and heartburn.  Musculoskeletal: Negative for back pain, joint pain and myalgias.  Skin: Negative.  Neurological: Negative for dizziness.  Psychiatric/Behavioral: The patient is not nervous/anxious.     Physical Exam Constitutional:      General: She is not in acute distress.    Appearance: She is well-developed. She is not diaphoretic.  Neck:     Thyroid: No thyromegaly.  Cardiovascular:     Rate and Rhythm: Normal rate and regular rhythm.     Pulses: Normal pulses.     Heart sounds: Normal heart sounds.  Pulmonary:     Effort: Pulmonary effort is normal. No respiratory distress.     Breath sounds: Normal breath sounds.  Abdominal:     General: Bowel sounds are normal. There is no distension.     Palpations: Abdomen is soft.     Tenderness: There is no abdominal tenderness.  Musculoskeletal:     Cervical back: Neck supple.     Right lower leg: No edema.     Left lower leg: No edema.     Comments: Is able to move all extremities Status post left hip hemiarthroplasty 03-12-20  Lymphadenopathy:     Cervical: No cervical adenopathy.  Skin:    General: Skin is warm and dry.     Comments: Incision line without signs of infection present   Neurological:     Mental Status: She is alert and oriented to person, place,  and time.  Psychiatric:        Mood and Affect: Mood normal.      ASSESSMENT/ PLAN:  TODAY  1. Closed fracture of left hip with delayed subsequent encounter: is stable will continue therapy as directed; will follow up with orthopedics. Will continue lovenox 30 mg daily; ultram 50 mg every 8 hours as needed robaxin 500 mg every 8 hours as needed   2. Chronic constipation: is stable will continue dulcolax suppository twice weekly; senna s 2 tabs nightly   3. Hypokalemia: is stable will continue k+ 20 meq daily   4. GERD without esophagitis: is stable will continue protonix 40 mg daily   5. Acute on chronic anemia: hgb 8.5  6. PAF (paroxsymal atrial fibrillation) heart rate is stable will continue cardizem 30 mg twice daily for rate control not a candidate for anticoagulation due to anemia.   Will check cbc 03-23-20    MD is aware of resident's narcotic use and is in agreement with current plan of care. We will attempt to wean resident as appropriate.  Ok Edwards NP [facility]  Contact [phone] Monday through Friday 8am- 5pm  After hours call [phone]

## 2020-03-17 ENCOUNTER — Encounter: Payer: Self-pay | Admitting: Internal Medicine

## 2020-03-17 ENCOUNTER — Non-Acute Institutional Stay (SKILLED_NURSING_FACILITY): Payer: Medicare Other | Admitting: Internal Medicine

## 2020-03-17 DIAGNOSIS — S72002G Fracture of unspecified part of neck of left femur, subsequent encounter for closed fracture with delayed healing: Secondary | ICD-10-CM

## 2020-03-17 DIAGNOSIS — R296 Repeated falls: Secondary | ICD-10-CM

## 2020-03-17 DIAGNOSIS — D649 Anemia, unspecified: Secondary | ICD-10-CM | POA: Diagnosis not present

## 2020-03-17 NOTE — Assessment & Plan Note (Signed)
Orthopedic follow-up 6/28

## 2020-03-17 NOTE — Assessment & Plan Note (Addendum)
D/C Robaxin and any other medications associated with increased fall risk as per the [place] list

## 2020-03-17 NOTE — Patient Instructions (Signed)
See assessment and plan under each diagnosis in the problem list and acutely for this visit 

## 2020-03-17 NOTE — Assessment & Plan Note (Signed)
No bleeding dyscrasias reported

## 2020-03-17 NOTE — Progress Notes (Signed)
NURSING HOME LOCATION:  [facility] ROOM NUMBER: 157/W   CODE STATUS: DNR  PCP:  Neale Burly, MD   This is a comprehensive admission note to [facility] performed on this date less than 30 days from date of admission. Included are preadmission medical/surgical history; reconciled medication list; family history; social history and comprehensive review of systems.  Corrections and additions to the records were documented. Comprehensive physical exam was also performed. Additionally a clinical summary was entered for each active diagnosis pertinent to this admission in the Problem List to enhance continuity of care.  HPI: Patient was hospitalized 6/16-6/19/2021 having fallen mechanically while ambulating without her walker, sustaining a left hip fracture.  She states she simply slipped.  There was no cardiac or neurologic prodrome. 6/17 Dr. Arther Abbott performed surgery.  Postoperatively hemoglobin was 7.2.  1 unit of PRBCs transfused because of fatigue, hypoxia, and limitations as to rehab participation. Course was complicated by acute hypoxic respiratory failure for which supplemental oxygen was prescribed. CBC documented chronic anemia which was stable. TSH was 0.3. DVT prophylaxis with Lovenox 30 mg subcu daily x30 days. WBAT.  Follow-up 2 weeks with Dr. Arther Abbott.  Past medical and surgical history: Includes PAF without anticoagulation prophylaxis due to chronic anemia ; and recent left humeral fracture.  In February she had slipped on uneven wooden slats and sustained a fracture.  Subsequent to that she developed pneumonia which was not Covid related. Surgeries include hysterectomy.  Social history: Nondrinker; never smoked.  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  Family history: Noncontributory due to advanced age   Review of systems: Despite the history of chronic anemia; she denies any bleeding dyscrasias.  Despite the physical findings noted  below; she denies any active cardiopulmonary symptoms.  Constitutional: No fever, significant weight change, fatigue  Eyes: No redness, discharge, pain, vision change ENT/mouth: No nasal congestion, purulent discharge, earache, change in hearing, sore throat  Cardiovascular: No chest pain, palpitations, paroxysmal nocturnal dyspnea, claudication, edema  Respiratory: No cough, sputum production, hemoptysis, DOE, significant snoring, apnea Gastrointestinal: No heartburn, dysphagia, abdominal pain, nausea /vomiting, rectal bleeding, melena, change in bowels Genitourinary: No dysuria, hematuria, pyuria, incontinence, nocturia Musculoskeletal: No joint stiffness, joint swelling, weakness, pain Dermatologic: No rash, pruritus, change in appearance of skin Neurologic: No dizziness, headache, syncope, seizures, numbness, tingling Psychiatric: No significant anxiety, depression, insomnia, anorexia Endocrine: No change in hair/skin/nails, excessive thirst, excessive hunger, excessive urination  Hematologic/lymphatic: No significant bruising, lymphadenopathy, abnormal bleeding Allergy/immunology: No itchy/watery eyes, significant sneezing, urticaria, angioedema  Physical exam:  Pertinent or positive findings: She appears somewhat younger than her stated age.  Heart rhythm and rate are irregular.  Initially she exhibited some rhonchi greater on the right than the left.  These essentially resolved with deep inspirations.  She has trace edema at the sock line.  Pedal pulses are palpable but decreased.  She has extensive bruising over the left hand.  General appearance: Adequately nourished; no acute distress, increased work of breathing is present.   Lymphatic: No lymphadenopathy about the head, neck, axilla. Eyes: No conjunctival inflammation or lid edema is present. There is no scleral icterus. Ears:  External ear exam shows no significant lesions or deformities.   Nose:  External nasal examination shows  no deformity or inflammation. Nasal mucosa are pink and moist without lesions, exudates Oral exam: Lips and gums are healthy appearing.There is no oropharyngeal erythema or exudate. Neck:  No thyromegaly, masses, tenderness noted.  Heart:  No gallop, murmur, click, rub.  Lungs: without wheezes, rales, rubs. Abdomen: Bowel sounds are normal.  Abdomen is soft and nontender with no organomegaly, hernias, masses. GU: Deferred  Extremities:  No cyanosis, clubbing Neurologic exam: Balance, Rhomberg, finger to nose testing could not be completed due to clinical state Skin: Warm & dry w/o tenting. No significant rash.  See clinical summary under each active problem in the Problem List with associated updated therapeutic plan

## 2020-03-21 ENCOUNTER — Encounter: Payer: Self-pay | Admitting: Adult Health

## 2020-03-21 DIAGNOSIS — I480 Paroxysmal atrial fibrillation: Secondary | ICD-10-CM | POA: Insufficient documentation

## 2020-03-21 DIAGNOSIS — K219 Gastro-esophageal reflux disease without esophagitis: Secondary | ICD-10-CM | POA: Insufficient documentation

## 2020-03-21 DIAGNOSIS — E876 Hypokalemia: Secondary | ICD-10-CM | POA: Insufficient documentation

## 2020-03-21 DIAGNOSIS — K5909 Other constipation: Secondary | ICD-10-CM | POA: Insufficient documentation

## 2020-03-23 ENCOUNTER — Non-Acute Institutional Stay (SKILLED_NURSING_FACILITY): Payer: Medicare Other | Admitting: Adult Health

## 2020-03-23 ENCOUNTER — Ambulatory Visit: Payer: Medicare Other | Admitting: Orthopedic Surgery

## 2020-03-23 ENCOUNTER — Other Ambulatory Visit (HOSPITAL_COMMUNITY)
Admission: RE | Admit: 2020-03-23 | Discharge: 2020-03-23 | Disposition: A | Discharge status: Home / Self Care | Payer: Medicare Other | Source: Skilled Nursing Facility | Attending: Internal Medicine | Admitting: Internal Medicine

## 2020-03-23 ENCOUNTER — Encounter: Payer: Self-pay | Admitting: Adult Health

## 2020-03-23 DIAGNOSIS — S72002G Fracture of unspecified part of neck of left femur, subsequent encounter for closed fracture with delayed healing: Secondary | ICD-10-CM

## 2020-03-23 DIAGNOSIS — E876 Hypokalemia: Secondary | ICD-10-CM

## 2020-03-23 DIAGNOSIS — K5909 Other constipation: Secondary | ICD-10-CM | POA: Diagnosis not present

## 2020-03-23 DIAGNOSIS — D649 Anemia, unspecified: Secondary | ICD-10-CM | POA: Insufficient documentation

## 2020-03-23 LAB — CBC
HCT: 32 % — ABNORMAL LOW (ref 36.0–46.0)
Hemoglobin: 10.2 g/dL — ABNORMAL LOW (ref 12.0–15.0)
MCH: 28.9 pg (ref 26.0–34.0)
MCHC: 31.9 g/dL (ref 30.0–36.0)
MCV: 90.7 fL (ref 80.0–100.0)
Platelets: 389 10*3/uL (ref 150–400)
RBC: 3.53 MIL/uL — ABNORMAL LOW (ref 3.87–5.11)
RDW: 15.1 % (ref 11.5–15.5)
WBC: 8.2 10*3/uL (ref 4.0–10.5)
nRBC: 0 % (ref 0.0–0.2)

## 2020-03-23 NOTE — Progress Notes (Signed)
Location:    [facility] Room Number: 157/W Place of Service:  SNF (31)   CODE STATUS: DNR  Allergies  Allergen Reactions  . Penicillins     unknown    Chief Complaint  Patient presents with  . Short Term Rehab         Closed fracture of left hip with delayed healing subsequent encounter:   Chronic constipation:    Hypokalemia:  Weekly follow up for the first 30 days post hospitalization.     HPI:  She is a 86 year old short term rehab patient being seen for the management of her chronic illnesses left hip fracture; constipation; hypokalemia. There are no reports of uncontrolled pain; no changes in her appetite; no reports of heart burn or constipation. She continues to participate in therapy.   Past Medical History:  Diagnosis Date  . A-fib ([place])   . Arthritis   . Chronic anemia   . GERD (gastroesophageal reflux disease)   . PAF (paroxysmal atrial fibrillation) (HCC)    No anticoagulation due to chronic anemia    Past Surgical History:  Procedure Laterality Date  . ABDOMINAL HYSTERECTOMY    . CATARACT EXTRACTION W/PHACO Right 01/04/2016   Procedure: CATARACT EXTRACTION PHACO AND INTRAOCULAR LENS PLACEMENT (IOC);  Surgeon: Tonny Branch, MD;  Location: [facility];  Service: Ophthalmology;  Laterality: Right;  CDE 9.06  . CATARACT EXTRACTION W/PHACO Left 01/21/2016   Procedure: CATARACT EXTRACTION PHACO AND INTRAOCULAR LENS PLACEMENT (IOC);  Surgeon: Tonny Branch, MD;  Location: [facility];  Service: Ophthalmology;  Laterality: Left;  CDE: 12.65  . HIP ARTHROPLASTY Left 03/12/2020   Procedure: ARTHROPLASTY BIPOLAR HIP (HEMIARTHROPLASTY);  Surgeon: Carole Civil, MD;  Location: [facility];  Service: Orthopedics;  Laterality: Left;  . TONSILLECTOMY      Social History   Socioeconomic History  . Marital status: Widowed    Spouse name: Not on file  . Number of children: Not on file  . Years of education: Not on file  . Highest education level: Not on file    Occupational History  . Not on file  Tobacco Use  . Smoking status: Never Smoker  . Smokeless tobacco: Never Used  Substance and Sexual Activity  . Alcohol use: No  . Drug use: No  . Sexual activity: Never    Birth control/protection: Surgical  Other Topics Concern  . Not on file  Social History Narrative  . Not on file   Social Determinants of Health   Financial Resource Strain:   . Difficulty of Paying Living Expenses:   Food Insecurity:   . Worried About Charity fundraiser in the Last Year:   . Arboriculturist in the Last Year:   Transportation Needs:   . Film/video editor (Medical):   Marland Kitchen Lack of Transportation (Non-Medical):   Physical Activity:   . Days of Exercise per Week:   . Minutes of Exercise per Session:   Stress:   . Feeling of Stress :   Social Connections:   . Frequency of Communication with Friends and Family:   . Frequency of Social Gatherings with Friends and Family:   . Attends Religious Services:   . Active Member of Clubs or Organizations:   . Attends Archivist Meetings:   Marland Kitchen Marital Status:   Intimate Partner Violence:   . Fear of Current or Ex-Partner:   . Emotionally Abused:   Marland Kitchen Physically Abused:   . Sexually Abused:  Family History  Problem Relation Age of Onset  . Dementia Mother       VITAL SIGNS BP 124/62   Pulse 94   Temp 99.2 F (37.3 C) (Oral)   Resp 20   Ht 5\' 1"  (1.549 m)   Wt 115 lb 14.4 oz (52.6 kg)   BMI 21.90 kg/m   Outpatient Encounter Medications as of 03/23/2020  Medication Sig  . acetaminophen (TYLENOL) 325 MG tablet Take 650 mg by mouth every 6 (six) hours as needed for mild pain or moderate pain. ALL PILLS ARE CRUSHED-MIXED IN APPLESAUCE  . Balsam [place]-Castor Oil (VENELEX) OINT Apply topically. amt: 1 aplication; topical Special Instructions: Apply to sacrum and bilateral buttocks q shift for prevention. Every Shift Day, Evening, Night  . bisacodyl (DULCOLAX) 10 MG suppository Place 1  suppository (10 mg total) rectally See admin instructions. Every Monday and Friday only  . Calcium-Vitamin D-Vitamin K (VIACTIV CALCIUM PLUS D) 650-12.5-40 MG-MCG-MCG CHEW Chew 1 each by mouth in the morning and at bedtime.  Marland Kitchen diltiazem (CARDIZEM) 30 MG tablet Take 1 tablet (30 mg total) by mouth 2 (two) times daily. ALL PILLS ARE CRUSHED-MIXED IN APPLESAUCE  . enoxaparin (LOVENOX) 30 MG/0.3ML injection Inject 0.3 mLs (30 mg total) into the skin daily. For post left hip surgery DVT prophylaxis  . feeding supplement, ENSURE ENLIVE, (ENSURE ENLIVE) LIQD Take 237 mLs by mouth 2 (two) times daily between meals.  . Multiple Vitamins-Minerals (PRESERVISION AREDS 2) CHEW Chew 1 tablet by mouth in the morning and at bedtime.  . NON FORMULARY Diet: __x___ Regular, ______ NAS, _______Consistent Carbohydrate, _______NPO _____Other  . NON FORMULARY Diet - Liquids: __X_Regular; ___Thickened ___ Consistency: ___ Nectar, ___Honey, ___ Pudding; ____ Fluid Restriction  . ondansetron (ZOFRAN) 4 MG tablet Take 1 tablet (4 mg total) by mouth every 6 (six) hours as needed for nausea.  . pantoprazole (PROTONIX) 40 MG tablet Take 1 tablet (40 mg total) by mouth daily.  . potassium chloride 20 MEQ/15ML (10%) SOLN Take 20 mEq by mouth daily.  Marland Kitchen senna-docusate (SENOKOT-S) 8.6-50 MG tablet Take 2 tablets by mouth at bedtime.  . traMADol (ULTRAM) 50 MG tablet Take 1 tablet (50 mg total) by mouth every 8 (eight) hours as needed for moderate pain or severe pain.  . [DISCONTINUED] methocarbamol (ROBAXIN) 500 MG tablet Take 1 tablet (500 mg total) by mouth every 8 (eight) hours as needed for muscle spasms.   No facility-administered encounter medications on file as of 03/23/2020.     SIGNIFICANT DIAGNOSTIC EXAMS   PREVIOUS  03-11-20: Chest x-ray: No active chest disease  Old left proximal humerus surgical neck fracture Aortic Atherosclerosis  03-11-20: left femur x-ray: Acute left hip subcapital femoral neck fracture.  Osteopenia  03-13-20: 2-d echo:  Left ventricular ejection fraction, by estimation, is 60 to 65%. The left ventricle has normal function. The left ventricle has no regional  wall motion abnormalities. Left ventricular diastolic parameters were normal.   NO NEW EXAMS.   LABS REVIEWED PREVIOUS   03-11-20: wbc 6.2; hgb 9.6; hct 30.5 mcv 91.9 plt 293; glucose 116; bun 18; creat 0.71; k+ 3.9; na++ 137; ca 8.8 tsh 0.298 03-14-20: wbc 11.1; hgb 7.2; hct 22.7; mcv 91.2 plt 213;glucose 124; bun 16; creat 0.68; k+ 3.4; na++ 131; ca 7.4 03-15-20: wbc 7.3; hgb 8.5; hct 26.9; mcv 90.3 plt 297  TODAY  03-23-20: wbc 8.2; hgb 10.2; hct 32.0; mcv 90.7 plt 359    Review of Systems  Constitutional: Negative for malaise/fatigue.  Respiratory: Negative for cough and shortness of breath.   Cardiovascular: Negative for chest pain, palpitations and leg swelling.  Gastrointestinal: Negative for abdominal pain, constipation and heartburn.  Musculoskeletal: Negative for back pain, joint pain and myalgias.  Skin: Negative.   Neurological: Negative for dizziness.  Psychiatric/Behavioral: The patient is not nervous/anxious.     Physical Exam Constitutional:      General: She is not in acute distress.    Appearance: She is well-developed. She is not diaphoretic.  Neck:     Thyroid: No thyromegaly.  Cardiovascular:     Rate and Rhythm: Normal rate and regular rhythm.     Pulses: Normal pulses.     Heart sounds: Normal heart sounds.  Pulmonary:     Effort: Pulmonary effort is normal. No respiratory distress.     Breath sounds: Normal breath sounds.  Abdominal:     General: Bowel sounds are normal. There is no distension.     Palpations: Abdomen is soft.     Tenderness: There is no abdominal tenderness.  Musculoskeletal:     Cervical back: Neck supple.     Right lower leg: No edema.     Left lower leg: No edema.     Comments: Is able to move all extremities Status post left hip hemiarthroplasty 03-12-20    Lymphadenopathy:     Cervical: No cervical adenopathy.  Skin:    General: Skin is warm and dry.  Neurological:     Mental Status: She is alert and oriented to person, place, and time.  Psychiatric:        Mood and Affect: Mood normal.      ASSESSMENT/ PLAN:  TODAY  1. Closed fracture of left hip with delayed healing subsequent encounter: is stable will continue therapy as directed and will follow up with orthopedics as indicated. Will continue lovenox 30 mg daily ultram 50 mg every 8 hours as needed robaxin 500 mg every 8 hours as needed  2. Chronic constipation: is stable will continue senna s 2 tabs nightly dulcolax supp twice weekly   3. Hypokalemia: is stable will continue k+ 20 meq daily    PREVIOUS  4. GERD without esophagitis: is stable will continue protonix 40 mg daily   5. Acute on chronic anemia: hgb 10.2  6. PAF (paroxsymal atrial fibrillation) heart rate is stable will continue cardizem 30 mg twice daily for rate control not a candidate for anticoagulation due to anemia.           MD is aware of resident's narcotic use and is in agreement with current plan of care. We will attempt to wean resident as appropriate.  Ok Edwards NP [facility]  Contact [phone] Monday through Friday 8am- 5pm  After hours call [phone]

## 2020-03-24 DIAGNOSIS — Z96642 Presence of left artificial hip joint: Secondary | ICD-10-CM | POA: Insufficient documentation

## 2020-03-25 ENCOUNTER — Encounter: Payer: Self-pay | Admitting: Adult Health

## 2020-03-25 ENCOUNTER — Non-Acute Institutional Stay (SKILLED_NURSING_FACILITY): Payer: Medicare Other | Admitting: Adult Health

## 2020-03-25 DIAGNOSIS — I480 Paroxysmal atrial fibrillation: Secondary | ICD-10-CM

## 2020-03-25 DIAGNOSIS — S42292D Other displaced fracture of upper end of left humerus, subsequent encounter for fracture with routine healing: Secondary | ICD-10-CM | POA: Diagnosis not present

## 2020-03-25 DIAGNOSIS — D649 Anemia, unspecified: Secondary | ICD-10-CM

## 2020-03-25 DIAGNOSIS — S72002G Fracture of unspecified part of neck of left femur, subsequent encounter for closed fracture with delayed healing: Secondary | ICD-10-CM | POA: Diagnosis not present

## 2020-03-25 NOTE — Progress Notes (Signed)
Location:    [facility] Room Number: 157/W Place of Service:  SNF (31)   CODE STATUS: DNR  Allergies  Allergen Reactions  . Penicillins     unknown    Chief Complaint  Patient presents with  . Acute Visit    Care Plan Meeting    HPI:  We have come together for her discharge care plan meeting. Family present. At this time she requires assistance with her adls; is incontinent bladder and bowel. Requires minimal assist with bed mobility. She is able to ambulate 40 feet requires max cueing for safety precautions. She scored a 13/25 on her SLUMS. Her goal remains to go home. She will need assistance upon discharge at this time she will need 24 hour care. She continues to be followed for her chronic illnesses including: Closed displaced fracture of proximal end of left humerus with routine healing subsequent encounter  Closed fracture of left hip with delayed healing subsequent encounter  PAF(paroxsymal atrial fibrillation) Acute on chronic anemia.   Past Medical History:  Diagnosis Date  . A-fib ([place])   . Arthritis   . Chronic anemia   . GERD (gastroesophageal reflux disease)   . PAF (paroxysmal atrial fibrillation) (HCC)    No anticoagulation due to chronic anemia    Past Surgical History:  Procedure Laterality Date  . ABDOMINAL HYSTERECTOMY    . CATARACT EXTRACTION W/PHACO Right 01/04/2016   Procedure: CATARACT EXTRACTION PHACO AND INTRAOCULAR LENS PLACEMENT (IOC);  Surgeon: Tonny Branch, MD;  Location: [facility];  Service: Ophthalmology;  Laterality: Right;  CDE 9.06  . CATARACT EXTRACTION W/PHACO Left 01/21/2016   Procedure: CATARACT EXTRACTION PHACO AND INTRAOCULAR LENS PLACEMENT (IOC);  Surgeon: Tonny Branch, MD;  Location: [facility];  Service: Ophthalmology;  Laterality: Left;  CDE: 12.65  . HIP ARTHROPLASTY Left 03/12/2020   Procedure: ARTHROPLASTY BIPOLAR HIP (HEMIARTHROPLASTY);  Surgeon: Carole Civil, MD;  Location: [facility];  Service: Orthopedics;   Laterality: Left;  . TONSILLECTOMY      Social History   Socioeconomic History  . Marital status: Widowed    Spouse name: Not on file  . Number of children: Not on file  . Years of education: Not on file  . Highest education level: Not on file  Occupational History  . Not on file  Tobacco Use  . Smoking status: Never Smoker  . Smokeless tobacco: Never Used  Substance and Sexual Activity  . Alcohol use: No  . Drug use: No  . Sexual activity: Never    Birth control/protection: Surgical  Other Topics Concern  . Not on file  Social History Narrative  . Not on file   Social Determinants of Health   Financial Resource Strain:   . Difficulty of Paying Living Expenses:   Food Insecurity:   . Worried About Charity fundraiser in the Last Year:   . Arboriculturist in the Last Year:   Transportation Needs:   . Film/video editor (Medical):   Marland Kitchen Lack of Transportation (Non-Medical):   Physical Activity:   . Days of Exercise per Week:   . Minutes of Exercise per Session:   Stress:   . Feeling of Stress :   Social Connections:   . Frequency of Communication with Friends and Family:   . Frequency of Social Gatherings with Friends and Family:   . Attends Religious Services:   . Active Member of Clubs or Organizations:   . Attends Archivist Meetings:   .  Marital Status:   Intimate Partner Violence:   . Fear of Current or Ex-Partner:   . Emotionally Abused:   Marland Kitchen Physically Abused:   . Sexually Abused:    Family History  Problem Relation Age of Onset  . Dementia Mother       VITAL SIGNS BP 124/62   Pulse 94   Temp 98.1 F (36.7 C) (Oral)   Resp 20   Ht 5\' 1"  (1.549 m)   Wt 109 lb (49.4 kg)   BMI 20.60 kg/m   Outpatient Encounter Medications as of 03/25/2020  Medication Sig  . acetaminophen (TYLENOL) 325 MG tablet Take 650 mg by mouth every 6 (six) hours as needed for mild pain or moderate pain. ALL PILLS ARE CRUSHED-MIXED IN APPLESAUCE  . Balsam  [place]-Castor Oil (VENELEX) OINT Apply topically. amt: 1 aplication; topical Special Instructions: Apply to sacrum and bilateral buttocks q shift for prevention. Every Shift Day, Evening, Night  . bisacodyl (DULCOLAX) 10 MG suppository Place 1 suppository (10 mg total) rectally See admin instructions. Every Monday and Friday only  . Calcium-Vitamin D-Vitamin K (VIACTIV CALCIUM PLUS D) 650-12.5-40 MG-MCG-MCG CHEW Chew 1 each by mouth in the morning and at bedtime.  Marland Kitchen diltiazem (CARDIZEM) 30 MG tablet Take 1 tablet (30 mg total) by mouth 2 (two) times daily. ALL PILLS ARE CRUSHED-MIXED IN APPLESAUCE  . enoxaparin (LOVENOX) 30 MG/0.3ML injection Inject 0.3 mLs (30 mg total) into the skin daily. For post left hip surgery DVT prophylaxis  . feeding supplement, ENSURE ENLIVE, (ENSURE ENLIVE) LIQD Take 237 mLs by mouth 2 (two) times daily between meals.  . Multiple Vitamins-Minerals (PRESERVISION AREDS 2) CHEW Chew 1 tablet by mouth in the morning and at bedtime.  . NON FORMULARY Diet: __x___ Regular, ______ NAS, _______Consistent Carbohydrate, _______NPO _____Other  . NON FORMULARY Diet - Liquids: __X_Regular; ___Thickened ___ Consistency: ___ Nectar, ___Honey, ___ Pudding; ____ Fluid Restriction  . ondansetron (ZOFRAN) 4 MG tablet Take 1 tablet (4 mg total) by mouth every 6 (six) hours as needed for nausea.  . pantoprazole (PROTONIX) 40 MG tablet Take 1 tablet (40 mg total) by mouth daily.  . potassium chloride 20 MEQ/15ML (10%) SOLN Take 20 mEq by mouth daily.  Marland Kitchen senna-docusate (SENOKOT-S) 8.6-50 MG tablet Take 2 tablets by mouth at bedtime.  . traMADol (ULTRAM) 50 MG tablet Take 1 tablet (50 mg total) by mouth every 8 (eight) hours as needed for moderate pain or severe pain.   No facility-administered encounter medications on file as of 03/25/2020.     SIGNIFICANT DIAGNOSTIC EXAMS   PREVIOUS  03-11-20: Chest x-ray: No active chest disease  Old left proximal humerus surgical neck fracture  Aortic Atherosclerosis  03-11-20: left femur x-ray: Acute left hip subcapital femoral neck fracture. Osteopenia  03-13-20: 2-d echo:  Left ventricular ejection fraction, by estimation, is 60 to 65%. The left ventricle has normal function. The left ventricle has no regional  wall motion abnormalities. Left ventricular diastolic parameters were normal.   NO NEW EXAMS.   LABS REVIEWED PREVIOUS   03-11-20: wbc 6.2; hgb 9.6; hct 30.5 mcv 91.9 plt 293; glucose 116; bun 18; creat 0.71; k+ 3.9; na++ 137; ca 8.8 tsh 0.298 03-14-20: wbc 11.1; hgb 7.2; hct 22.7; mcv 91.2 plt 213;glucose 124; bun 16; creat 0.68; k+ 3.4; na++ 131; ca 7.4 03-15-20: wbc 7.3; hgb 8.5; hct 26.9; mcv 90.3 plt 297 03-23-20: wbc 8.2; hgb 10.2; hct 32.0; mcv 90.7 plt 359   NO NEW LABS.   Review  of Systems  Constitutional: Negative for malaise/fatigue.  Respiratory: Negative for cough and shortness of breath.   Cardiovascular: Negative for chest pain, palpitations and leg swelling.  Gastrointestinal: Negative for abdominal pain, constipation and heartburn.  Musculoskeletal: Negative for back pain, joint pain and myalgias.  Skin: Negative.   Neurological: Negative for dizziness.  Psychiatric/Behavioral: The patient is not nervous/anxious.     Physical Exam Constitutional:      General: She is not in acute distress.    Appearance: She is well-developed. She is not diaphoretic.  Neck:     Thyroid: No thyromegaly.  Cardiovascular:     Rate and Rhythm: Normal rate and regular rhythm.     Pulses: Normal pulses.     Heart sounds: Normal heart sounds.  Pulmonary:     Effort: Pulmonary effort is normal. No respiratory distress.     Breath sounds: Normal breath sounds.  Abdominal:     General: Bowel sounds are normal. There is no distension.     Palpations: Abdomen is soft.     Tenderness: There is no abdominal tenderness.  Musculoskeletal:     Cervical back: Neck supple.     Right lower leg: No edema.     Left lower  leg: No edema.     Comments:  Is able to move all extremities Status post left hip hemiarthroplasty 03-12-20    Lymphadenopathy:     Cervical: No cervical adenopathy.  Skin:    General: Skin is warm and dry.  Neurological:     Mental Status: She is alert. Mental status is at baseline.  Psychiatric:        Mood and Affect: Mood normal.       ASSESSMENT/ PLAN:  TODAY  1. Closed displaced fracture of proximal end of left humerus with routine healing subsequent encounter 2. Closed fracture of left hip with delayed healing subsequent encounter 3. PAF(paroxsymal atrial fibrillation) 4. Acute on chronic anemia.   Will continue therapy as directed Will continue current medications Will continue current plane of care Her goal at this time is to return home Will continue to monitor her status.   MD is aware of resident's narcotic use and is in agreement with current plan of care. We will attempt to wean resident as appropriate.  Ok Edwards NP [facility]  Contact [phone] Monday through Friday 8am- 5pm  After hours call [phone]

## 2020-03-26 ENCOUNTER — Encounter: Payer: Self-pay | Admitting: Orthopedic Surgery

## 2020-03-26 ENCOUNTER — Ambulatory Visit (INDEPENDENT_AMBULATORY_CARE_PROVIDER_SITE_OTHER): Payer: Medicare Other | Admitting: Orthopedic Surgery

## 2020-03-26 VITALS — Ht 61.0 in | Wt 109.0 lb

## 2020-03-26 DIAGNOSIS — S72002D Fracture of unspecified part of neck of left femur, subsequent encounter for closed fracture with routine healing: Secondary | ICD-10-CM

## 2020-03-26 DIAGNOSIS — Z96642 Presence of left artificial hip joint: Secondary | ICD-10-CM

## 2020-03-26 NOTE — Progress Notes (Signed)
Chief Complaint  Patient presents with  . Routine Post Op    Lt hip DOS 03/12/20    Wound check : staples out   Fu 2 months   Left hip fx s/p bipolar replacement   Encounter Diagnoses  Name Primary?  . S/P hip replacement, left (bipolar for left hip fracture treatment) 03/12/20 Yes  . Closed fracture dislocation of left hip joint with routine healing, subsequent encounter

## 2020-03-31 ENCOUNTER — Encounter: Payer: Self-pay | Admitting: Adult Health

## 2020-03-31 ENCOUNTER — Non-Acute Institutional Stay (SKILLED_NURSING_FACILITY): Payer: Medicare Other | Admitting: Adult Health

## 2020-03-31 DIAGNOSIS — K219 Gastro-esophageal reflux disease without esophagitis: Secondary | ICD-10-CM | POA: Diagnosis not present

## 2020-03-31 DIAGNOSIS — D649 Anemia, unspecified: Secondary | ICD-10-CM | POA: Diagnosis not present

## 2020-03-31 DIAGNOSIS — I480 Paroxysmal atrial fibrillation: Secondary | ICD-10-CM | POA: Diagnosis not present

## 2020-03-31 NOTE — Progress Notes (Signed)
Location:    [facility] Room Number: 128/P Place of Service:  SNF (31)   CODE STATUS: DNR  Allergies  Allergen Reactions  . Penicillins     unknown    Chief Complaint  Patient presents with  . Short Term Rehab        GERD without esophagitis   Acute on chronic anemia:  PAF (paroxsymal atrial fibrillation):    Weekly follow up for the first 30 days post hospitalization.     HPI:  She is a 86 year old short term rehab patient being seen for the management of her chronic illnesses: gerd; anemia; paf. There are no reports of uncontrolled pain; no reports of constipation; no heart burn; no anxiety.   Past Medical History:  Diagnosis Date  . A-fib ([place])   . Arthritis   . Chronic anemia   . GERD (gastroesophageal reflux disease)   . PAF (paroxysmal atrial fibrillation) (HCC)    No anticoagulation due to chronic anemia    Past Surgical History:  Procedure Laterality Date  . ABDOMINAL HYSTERECTOMY    . CATARACT EXTRACTION W/PHACO Right 01/04/2016   Procedure: CATARACT EXTRACTION PHACO AND INTRAOCULAR LENS PLACEMENT (IOC);  Surgeon: Tonny Branch, MD;  Location: [facility];  Service: Ophthalmology;  Laterality: Right;  CDE 9.06  . CATARACT EXTRACTION W/PHACO Left 01/21/2016   Procedure: CATARACT EXTRACTION PHACO AND INTRAOCULAR LENS PLACEMENT (IOC);  Surgeon: Tonny Branch, MD;  Location: [facility];  Service: Ophthalmology;  Laterality: Left;  CDE: 12.65  . HIP ARTHROPLASTY Left 03/12/2020   Procedure: ARTHROPLASTY BIPOLAR HIP (HEMIARTHROPLASTY);  Surgeon: Carole Civil, MD;  Location: [facility];  Service: Orthopedics;  Laterality: Left;  . TONSILLECTOMY      Social History   Socioeconomic History  . Marital status: Widowed    Spouse name: Not on file  . Number of children: Not on file  . Years of education: Not on file  . Highest education level: Not on file  Occupational History  . Not on file  Tobacco Use  . Smoking status: Never Smoker  . Smokeless  tobacco: Never Used  Substance and Sexual Activity  . Alcohol use: No  . Drug use: No  . Sexual activity: Never    Birth control/protection: Surgical  Other Topics Concern  . Not on file  Social History Narrative  . Not on file   Social Determinants of Health   Financial Resource Strain:   . Difficulty of Paying Living Expenses:   Food Insecurity:   . Worried About Charity fundraiser in the Last Year:   . Arboriculturist in the Last Year:   Transportation Needs:   . Film/video editor (Medical):   Marland Kitchen Lack of Transportation (Non-Medical):   Physical Activity:   . Days of Exercise per Week:   . Minutes of Exercise per Session:   Stress:   . Feeling of Stress :   Social Connections:   . Frequency of Communication with Friends and Family:   . Frequency of Social Gatherings with Friends and Family:   . Attends Religious Services:   . Active Member of Clubs or Organizations:   . Attends Archivist Meetings:   Marland Kitchen Marital Status:   Intimate Partner Violence:   . Fear of Current or Ex-Partner:   . Emotionally Abused:   Marland Kitchen Physically Abused:   . Sexually Abused:    Family History  Problem Relation Age of Onset  . Dementia Mother  VITAL SIGNS BP 130/68   Pulse 70   Temp 98.2 F (36.8 C) (Oral)   Resp 20   Ht 5\' 1"  (1.549 m)   Wt 104 lb (47.2 kg)   BMI 19.65 kg/m   Outpatient Encounter Medications as of 03/31/2020  Medication Sig  . acetaminophen (TYLENOL) 325 MG tablet Take 650 mg by mouth every 6 (six) hours as needed for mild pain or moderate pain. ALL PILLS ARE CRUSHED-MIXED IN APPLESAUCE  . Balsam [place]-Castor Oil (VENELEX) OINT Apply topically. amt: 1 aplication; topical Special Instructions: Apply to sacrum and bilateral buttocks q shift for prevention. Every Shift Day, Evening, Night  . bisacodyl (DULCOLAX) 10 MG suppository Place 1 suppository (10 mg total) rectally See admin instructions. Every Monday and Friday only  . Calcium-Vitamin  D-Vitamin K (VIACTIV CALCIUM PLUS D) 650-12.5-40 MG-MCG-MCG CHEW Chew 1 each by mouth in the morning and at bedtime.  Marland Kitchen diltiazem (CARDIZEM) 30 MG tablet Take 1 tablet (30 mg total) by mouth 2 (two) times daily. ALL PILLS ARE CRUSHED-MIXED IN APPLESAUCE  . enoxaparin (LOVENOX) 30 MG/0.3ML injection Inject 0.3 mLs (30 mg total) into the skin daily. For post left hip surgery DVT prophylaxis  . feeding supplement, ENSURE ENLIVE, (ENSURE ENLIVE) LIQD Take 237 mLs by mouth 2 (two) times daily between meals.  . Multiple Vitamins-Minerals (PRESERVISION AREDS 2) CHEW Chew 1 tablet by mouth in the morning and at bedtime.  . NON FORMULARY Diet: __x___ Regular, ______ NAS, _______Consistent Carbohydrate, _______NPO _____Other  . NON FORMULARY Diet - Liquids: __X_Regular; ___Thickened ___ Consistency: ___ Nectar, ___Honey, ___ Pudding; ____ Fluid Restriction  . ondansetron (ZOFRAN) 4 MG tablet Take 1 tablet (4 mg total) by mouth every 6 (six) hours as needed for nausea.  . pantoprazole (PROTONIX) 40 MG tablet Take 1 tablet (40 mg total) by mouth daily.  . potassium chloride 20 MEQ/15ML (10%) SOLN Take 20 mEq by mouth daily.  Marland Kitchen senna-docusate (SENOKOT-S) 8.6-50 MG tablet Take 2 tablets by mouth at bedtime.  . traMADol (ULTRAM) 50 MG tablet Take 1 tablet (50 mg total) by mouth every 8 (eight) hours as needed for moderate pain or severe pain.   No facility-administered encounter medications on file as of 03/31/2020.     SIGNIFICANT DIAGNOSTIC EXAMS  PREVIOUS  03-11-20: Chest x-ray: No active chest disease  Old left proximal humerus surgical neck fracture Aortic Atherosclerosis  03-11-20: left femur x-ray: Acute left hip subcapital femoral neck fracture. Osteopenia  03-13-20: 2-d echo:  Left ventricular ejection fraction, by estimation, is 60 to 65%. The left ventricle has normal function. The left ventricle has no regional  wall motion abnormalities. Left ventricular diastolic parameters were normal.    NO NEW EXAMS.   LABS REVIEWED PREVIOUS   03-11-20: wbc 6.2; hgb 9.6; hct 30.5 mcv 91.9 plt 293; glucose 116; bun 18; creat 0.71; k+ 3.9; na++ 137; ca 8.8 tsh 0.298 03-14-20: wbc 11.1; hgb 7.2; hct 22.7; mcv 91.2 plt 213;glucose 124; bun 16; creat 0.68; k+ 3.4; na++ 131; ca 7.4 03-15-20: wbc 7.3; hgb 8.5; hct 26.9; mcv 90.3 plt 297 03-23-20: wbc 8.2; hgb 10.2; hct 32.0; mcv 90.7 plt 359  NO NEW LABS.     Review of Systems  Constitutional: Negative for malaise/fatigue.  Respiratory: Negative for cough and shortness of breath.   Cardiovascular: Negative for chest pain, palpitations and leg swelling.  Gastrointestinal: Negative for abdominal pain, constipation and heartburn.  Musculoskeletal: Negative for back pain, joint pain and myalgias.  Skin: Negative.  Neurological: Negative for dizziness.  Psychiatric/Behavioral: The patient is not nervous/anxious.      Physical Exam Constitutional:      General: She is not in acute distress.    Appearance: She is well-developed. She is not diaphoretic.  Neck:     Thyroid: No thyromegaly.  Cardiovascular:     Rate and Rhythm: Normal rate and regular rhythm.     Pulses: Normal pulses.     Heart sounds: Normal heart sounds.  Pulmonary:     Effort: Pulmonary effort is normal. No respiratory distress.     Breath sounds: Normal breath sounds.  Abdominal:     General: Bowel sounds are normal. There is no distension.     Palpations: Abdomen is soft.     Tenderness: There is no abdominal tenderness.  Musculoskeletal:     Cervical back: Neck supple.     Right lower leg: No edema.     Left lower leg: No edema.  Lymphadenopathy:     Cervical: No cervical adenopathy.  Skin:    General: Skin is warm and dry.     Comments:   Is able to move all extremities Status post left hip hemiarthroplasty 03-12-20     Neurological:     Mental Status: She is alert. Mental status is at baseline.  Psychiatric:        Mood and Affect: Mood normal.        ASSESSMENT/ PLAN:  TODAY  1. GERD without esophagitis is stable will continue protonix 40 mg daily   2. Acute on chronic anemia: hgb 10.2 will monitor   3. PAF (paroxsymal atrial fibrillation): heart rate is stable will continue cardizem 30 mg twice daily for rate control not a candidate for anticoagulation therapy due to anemia.    PREVIOUS  4. Closed fracture of left hip with delayed healing subsequent encounter: is stable will continue therapy as directed and will follow up with orthopedics as indicated. Will continue lovenox 30 mg daily robaxin 500 mg every 8 hours as needed  5. Chronic constipation: is stable will continue senna s 2 tabs nightly dulcolax supp twice weekly   6. Hypokalemia: is stable will continue k+ 20 meq daily          MD is aware of resident's narcotic use and is in agreement with current plan of care. We will attempt to wean resident as appropriate.  Ok Edwards NP [facility]  Contact [phone] Monday through Friday 8am- 5pm  After hours call [phone]

## 2020-04-02 ENCOUNTER — Other Ambulatory Visit: Payer: Self-pay | Admitting: Adult Health

## 2020-04-02 ENCOUNTER — Encounter: Payer: Self-pay | Admitting: Adult Health

## 2020-04-02 ENCOUNTER — Non-Acute Institutional Stay (SKILLED_NURSING_FACILITY): Payer: Medicare Other | Admitting: Adult Health

## 2020-04-02 DIAGNOSIS — D649 Anemia, unspecified: Secondary | ICD-10-CM

## 2020-04-02 DIAGNOSIS — I480 Paroxysmal atrial fibrillation: Secondary | ICD-10-CM

## 2020-04-02 DIAGNOSIS — S72002G Fracture of unspecified part of neck of left femur, subsequent encounter for closed fracture with delayed healing: Secondary | ICD-10-CM

## 2020-04-02 NOTE — Progress Notes (Signed)
Location:    [facility] Room Number: 128/P Place of Service:  SNF (31)   CODE STATUS: DNR  Allergies  Allergen Reactions  . Penicillins     unknown    Chief Complaint  Patient presents with  . Acute Visit    Care Plan Meeting    HPI:  We have come together for her care plan meeting. Family present. BIMS 12/15 mood 3/30. She has not had any falls; her weight is stable. We are stopping her ultram at this time and will begin tylenol cr 605 mg every 6 hours routinely. She continues to participate therapy. She is ambulating 100 feet. She is practicing steps. She does have a shuffling gait at times. She requires minimal assist with her lower body adls. She is occasionally incontinent of bladder and bowel. She has a hospital bed at home. Her goal is to return back home. She will need a wheelchair. She continues to be followed for her chronic illnesses including: Closed fracture of left hip with delayed healing subsequent encounter Acute on chronic anemia PAF (paroxysmal atrial fibrillation)  Past Medical History:  Diagnosis Date  . A-fib ([place])   . Arthritis   . Chronic anemia   . GERD (gastroesophageal reflux disease)   . PAF (paroxysmal atrial fibrillation) (HCC)    No anticoagulation due to chronic anemia    Past Surgical History:  Procedure Laterality Date  . ABDOMINAL HYSTERECTOMY    . CATARACT EXTRACTION W/PHACO Right 01/04/2016   Procedure: CATARACT EXTRACTION PHACO AND INTRAOCULAR LENS PLACEMENT (IOC);  Surgeon: Tonny Branch, MD;  Location: [facility];  Service: Ophthalmology;  Laterality: Right;  CDE 9.06  . CATARACT EXTRACTION W/PHACO Left 01/21/2016   Procedure: CATARACT EXTRACTION PHACO AND INTRAOCULAR LENS PLACEMENT (IOC);  Surgeon: Tonny Branch, MD;  Location: [facility];  Service: Ophthalmology;  Laterality: Left;  CDE: 12.65  . HIP ARTHROPLASTY Left 03/12/2020   Procedure: ARTHROPLASTY BIPOLAR HIP (HEMIARTHROPLASTY);  Surgeon: Carole Civil, MD;   Location: [facility];  Service: Orthopedics;  Laterality: Left;  . TONSILLECTOMY      Social History   Socioeconomic History  . Marital status: Widowed    Spouse name: Not on file  . Number of children: Not on file  . Years of education: Not on file  . Highest education level: Not on file  Occupational History  . Not on file  Tobacco Use  . Smoking status: Never Smoker  . Smokeless tobacco: Never Used  Substance and Sexual Activity  . Alcohol use: No  . Drug use: No  . Sexual activity: Never    Birth control/protection: Surgical  Other Topics Concern  . Not on file  Social History Narrative  . Not on file   Social Determinants of Health   Financial Resource Strain:   . Difficulty of Paying Living Expenses:   Food Insecurity:   . Worried About Charity fundraiser in the Last Year:   . Arboriculturist in the Last Year:   Transportation Needs:   . Film/video editor (Medical):   Marland Kitchen Lack of Transportation (Non-Medical):   Physical Activity:   . Days of Exercise per Week:   . Minutes of Exercise per Session:   Stress:   . Feeling of Stress :   Social Connections:   . Frequency of Communication with Friends and Family:   . Frequency of Social Gatherings with Friends and Family:   . Attends Religious Services:   . Active  Member of Clubs or Organizations:   . Attends Archivist Meetings:   Marland Kitchen Marital Status:   Intimate Partner Violence:   . Fear of Current or Ex-Partner:   . Emotionally Abused:   Marland Kitchen Physically Abused:   . Sexually Abused:    Family History  Problem Relation Age of Onset  . Dementia Mother       VITAL SIGNS BP (!) 119/55   Pulse 97   Temp 98.2 F (36.8 C) (Oral)   Ht 5\' 1"  (1.549 m)   Wt 104 lb (47.2 kg)   BMI 19.65 kg/m   Outpatient Encounter Medications as of 04/02/2020  Medication Sig  . acetaminophen (TYLENOL) 325 MG tablet Take 650 mg by mouth every 6 (six) hours as needed for mild pain or moderate pain. ALL PILLS ARE  CRUSHED-MIXED IN APPLESAUCE  . Balsam [place]-Castor Oil (VENELEX) OINT Apply topically. amt: 1 aplication; topical Special Instructions: Apply to sacrum and bilateral buttocks q shift for prevention. Every Shift Day, Evening, Night  . bisacodyl (DULCOLAX) 10 MG suppository Place 1 suppository (10 mg total) rectally See admin instructions. Every Monday and Friday only  . Calcium-Vitamin D-Vitamin K (VIACTIV CALCIUM PLUS D) 650-12.5-40 MG-MCG-MCG CHEW Chew 1 each by mouth in the morning and at bedtime.  Marland Kitchen diltiazem (CARDIZEM) 30 MG tablet Take 1 tablet (30 mg total) by mouth 2 (two) times daily. ALL PILLS ARE CRUSHED-MIXED IN APPLESAUCE  . enoxaparin (LOVENOX) 30 MG/0.3ML injection Inject 0.3 mLs (30 mg total) into the skin daily. For post left hip surgery DVT prophylaxis  . feeding supplement, ENSURE ENLIVE, (ENSURE ENLIVE) LIQD Take 237 mLs by mouth 2 (two) times daily between meals.  . Multiple Vitamins-Minerals (PRESERVISION AREDS 2) CHEW Chew 1 tablet by mouth in the morning and at bedtime.  . NON FORMULARY Diet: __x___ Regular, ______ NAS, _______Consistent Carbohydrate, _______NPO _____Other  . NON FORMULARY Diet - Liquids: __X_Regular; ___Thickened ___ Consistency: ___ Nectar, ___Honey, ___ Pudding; ____ Fluid Restriction  . ondansetron (ZOFRAN) 4 MG tablet Take 1 tablet (4 mg total) by mouth every 6 (six) hours as needed for nausea.  . pantoprazole (PROTONIX) 40 MG tablet Take 1 tablet (40 mg total) by mouth daily.  . potassium chloride 20 MEQ/15ML (10%) SOLN Take 20 mEq by mouth daily.  Marland Kitchen senna-docusate (SENOKOT-S) 8.6-50 MG tablet Take 2 tablets by mouth at bedtime.  . traMADol (ULTRAM) 50 MG tablet Take 1 tablet (50 mg total) by mouth every 8 (eight) hours as needed for moderate pain or severe pain.   No facility-administered encounter medications on file as of 04/02/2020.     SIGNIFICANT DIAGNOSTIC EXAMS   PREVIOUS  03-11-20: Chest x-ray: No active chest disease  Old left  proximal humerus surgical neck fracture Aortic Atherosclerosis  03-11-20: left femur x-ray: Acute left hip subcapital femoral neck fracture. Osteopenia  03-13-20: 2-d echo:  Left ventricular ejection fraction, by estimation, is 60 to 65%. The left ventricle has normal function. The left ventricle has no regional  wall motion abnormalities. Left ventricular diastolic parameters were normal.   NO NEW EXAMS.   LABS REVIEWED PREVIOUS   03-11-20: wbc 6.2; hgb 9.6; hct 30.5 mcv 91.9 plt 293; glucose 116; bun 18; creat 0.71; k+ 3.9; na++ 137; ca 8.8 tsh 0.298 03-14-20: wbc 11.1; hgb 7.2; hct 22.7; mcv 91.2 plt 213;glucose 124; bun 16; creat 0.68; k+ 3.4; na++ 131; ca 7.4 03-15-20: wbc 7.3; hgb 8.5; hct 26.9; mcv 90.3 plt 297 03-23-20: wbc 8.2; hgb 10.2; hct  32.0; mcv 90.7 plt 359  NO NEW LABS.    Review of Systems  Constitutional: Negative for malaise/fatigue.  Respiratory: Negative for cough and shortness of breath.   Cardiovascular: Negative for chest pain, palpitations and leg swelling.  Gastrointestinal: Negative for abdominal pain, constipation and heartburn.  Musculoskeletal: Negative for back pain, joint pain and myalgias.  Skin: Negative.   Neurological: Negative for dizziness.  Psychiatric/Behavioral: The patient is not nervous/anxious.     Physical Exam Constitutional:      General: She is not in acute distress.    Appearance: She is well-developed. She is not diaphoretic.  Eyes:     Comments: Macular degeneration   Neck:     Thyroid: No thyromegaly.  Cardiovascular:     Rate and Rhythm: Normal rate and regular rhythm.     Heart sounds: Normal heart sounds.  Pulmonary:     Effort: Pulmonary effort is normal. No respiratory distress.     Breath sounds: Normal breath sounds.  Abdominal:     General: Bowel sounds are normal. There is no distension.     Palpations: Abdomen is soft.     Tenderness: There is no abdominal tenderness.  Musculoskeletal:     Cervical back: Neck  supple.     Right lower leg: No edema.     Left lower leg: No edema.     Comments:  Is able to move all extremities Status post left hip hemiarthroplasty 03-12-20      Lymphadenopathy:     Cervical: No cervical adenopathy.  Skin:    General: Skin is warm and dry.  Neurological:     Mental Status: She is alert and oriented to person, place, and time.  Psychiatric:        Mood and Affect: Mood normal.      ASSESSMENT/ PLAN:  TODAY  1. Closed fracture of left hip with delayed healing subsequent encounter 2. Acute on chronic anemia 3. PAF (paroxysmal atrial fibrillation)  Will continue current medications Will continue current plan of care Will continue therapy as directed Her goal is to return back home She will more than likely need a wheelchair at discharge    MD is aware of resident's narcotic use and is in agreement with current plan of care. We will attempt to wean resident as appropriate.  Ok Edwards NP [facility]  Contact [phone] Monday through Friday 8am- 5pm  After hours call [phone]

## 2020-04-06 ENCOUNTER — Encounter: Payer: Self-pay | Admitting: Adult Health

## 2020-04-06 ENCOUNTER — Non-Acute Institutional Stay (SKILLED_NURSING_FACILITY): Payer: Medicare Other | Admitting: Adult Health

## 2020-04-06 DIAGNOSIS — K5909 Other constipation: Secondary | ICD-10-CM

## 2020-04-06 DIAGNOSIS — E876 Hypokalemia: Secondary | ICD-10-CM

## 2020-04-06 DIAGNOSIS — S72002G Fracture of unspecified part of neck of left femur, subsequent encounter for closed fracture with delayed healing: Secondary | ICD-10-CM

## 2020-04-06 NOTE — Progress Notes (Signed)
Location:    [facility] Room Number: 128/P Place of Service:  SNF (31)   CODE STATUS: DNR  Allergies  Allergen Reactions  . Penicillins     unknown    Chief Complaint  Patient presents with  . Short Term Rehab         Closed fracture of left hip with delayed healing subsequent encounter:    Chronic constipation:   Hypokalemia:   Weekly follow up for the first 30 days post hospitalization.     HPI:  She is a 86 year old short term rehab patient being seen for the management of her chronic illnesses; left hip fracture; constipation; hypokalemia. There are no reports of uncontrolled pain; no reports of heart burn or constipation. She continues to participate in therapy.   Past Medical History:  Diagnosis Date  . A-fib ([place])   . Arthritis   . Chronic anemia   . GERD (gastroesophageal reflux disease)   . PAF (paroxysmal atrial fibrillation) (HCC)    No anticoagulation due to chronic anemia    Past Surgical History:  Procedure Laterality Date  . ABDOMINAL HYSTERECTOMY    . CATARACT EXTRACTION W/PHACO Right 01/04/2016   Procedure: CATARACT EXTRACTION PHACO AND INTRAOCULAR LENS PLACEMENT (IOC);  Surgeon: Tonny Branch, MD;  Location: [facility];  Service: Ophthalmology;  Laterality: Right;  CDE 9.06  . CATARACT EXTRACTION W/PHACO Left 01/21/2016   Procedure: CATARACT EXTRACTION PHACO AND INTRAOCULAR LENS PLACEMENT (IOC);  Surgeon: Tonny Branch, MD;  Location: [facility];  Service: Ophthalmology;  Laterality: Left;  CDE: 12.65  . HIP ARTHROPLASTY Left 03/12/2020   Procedure: ARTHROPLASTY BIPOLAR HIP (HEMIARTHROPLASTY);  Surgeon: Carole Civil, MD;  Location: [facility];  Service: Orthopedics;  Laterality: Left;  . TONSILLECTOMY      Social History   Socioeconomic History  . Marital status: Widowed    Spouse name: Not on file  . Number of children: Not on file  . Years of education: Not on file  . Highest education level: Not on file  Occupational History  . Not  on file  Tobacco Use  . Smoking status: Never Smoker  . Smokeless tobacco: Never Used  Substance and Sexual Activity  . Alcohol use: No  . Drug use: No  . Sexual activity: Never    Birth control/protection: Surgical  Other Topics Concern  . Not on file  Social History Narrative  . Not on file   Social Determinants of Health   Financial Resource Strain:   . Difficulty of Paying Living Expenses:   Food Insecurity:   . Worried About Charity fundraiser in the Last Year:   . Arboriculturist in the Last Year:   Transportation Needs:   . Film/video editor (Medical):   Marland Kitchen Lack of Transportation (Non-Medical):   Physical Activity:   . Days of Exercise per Week:   . Minutes of Exercise per Session:   Stress:   . Feeling of Stress :   Social Connections:   . Frequency of Communication with Friends and Family:   . Frequency of Social Gatherings with Friends and Family:   . Attends Religious Services:   . Active Member of Clubs or Organizations:   . Attends Archivist Meetings:   Marland Kitchen Marital Status:   Intimate Partner Violence:   . Fear of Current or Ex-Partner:   . Emotionally Abused:   Marland Kitchen Physically Abused:   . Sexually Abused:    Family History  Problem Relation Age of Onset  . Dementia Mother       VITAL SIGNS BP 106/65   Pulse 89   Temp 97.6 F (36.4 C) (Oral)   Resp 20   Ht 5\' 1"  (1.549 m)   Wt 104 lb (47.2 kg)   BMI 19.65 kg/m   Outpatient Encounter Medications as of 04/06/2020  Medication Sig  . acetaminophen (TYLENOL) 325 MG tablet Take 650 mg by mouth every 6 (six) hours as needed for mild pain or moderate pain. ALL PILLS ARE CRUSHED-MIXED IN APPLESAUCE  . Balsam [place]-Castor Oil (VENELEX) OINT Apply topically. amt: 1 aplication; topical Special Instructions: Apply to sacrum and bilateral buttocks q shift for prevention. Every Shift Day, Evening, Night  . bisacodyl (DULCOLAX) 10 MG suppository Place 1 suppository (10 mg total) rectally See  admin instructions. Every Monday and Friday only  . Calcium-Vitamin D-Vitamin K (VIACTIV CALCIUM PLUS D) 650-12.5-40 MG-MCG-MCG CHEW Chew 1 each by mouth in the morning and at bedtime.  Marland Kitchen diltiazem (CARDIZEM) 30 MG tablet Take 1 tablet (30 mg total) by mouth 2 (two) times daily. ALL PILLS ARE CRUSHED-MIXED IN APPLESAUCE  . enoxaparin (LOVENOX) 30 MG/0.3ML injection Inject 0.3 mLs (30 mg total) into the skin daily. For post left hip surgery DVT prophylaxis  . feeding supplement, ENSURE ENLIVE, (ENSURE ENLIVE) LIQD Take 237 mLs by mouth 2 (two) times daily between meals.  . Multiple Vitamins-Minerals (PRESERVISION AREDS 2) CHEW Chew 1 tablet by mouth in the morning and at bedtime.  . NON FORMULARY Diet: __x___ Regular, ______ NAS, _______Consistent Carbohydrate, _______NPO _____Other  . NON FORMULARY Diet - Liquids: __X_Regular; ___Thickened ___ Consistency: ___ Nectar, ___Honey, ___ Pudding; ____ Fluid Restriction  . ondansetron (ZOFRAN) 4 MG tablet Take 1 tablet (4 mg total) by mouth every 6 (six) hours as needed for nausea.  . pantoprazole (PROTONIX) 40 MG tablet Take 1 tablet (40 mg total) by mouth daily.  . potassium chloride 20 MEQ/15ML (10%) SOLN Take 20 mEq by mouth daily.  Marland Kitchen senna-docusate (SENOKOT-S) 8.6-50 MG tablet Take 2 tablets by mouth at bedtime.   No facility-administered encounter medications on file as of 04/06/2020.     SIGNIFICANT DIAGNOSTIC EXAMS   PREVIOUS  03-11-20: Chest x-ray: No active chest disease  Old left proximal humerus surgical neck fracture Aortic Atherosclerosis  03-11-20: left femur x-ray: Acute left hip subcapital femoral neck fracture. Osteopenia  03-13-20: 2-d echo:  Left ventricular ejection fraction, by estimation, is 60 to 65%. The left ventricle has normal function. The left ventricle has no regional  wall motion abnormalities. Left ventricular diastolic parameters were normal.   NO NEW EXAMS.   LABS REVIEWED PREVIOUS   03-11-20: wbc 6.2; hgb  9.6; hct 30.5 mcv 91.9 plt 293; glucose 116; bun 18; creat 0.71; k+ 3.9; na++ 137; ca 8.8 tsh 0.298 03-14-20: wbc 11.1; hgb 7.2; hct 22.7; mcv 91.2 plt 213;glucose 124; bun 16; creat 0.68; k+ 3.4; na++ 131; ca 7.4 03-15-20: wbc 7.3; hgb 8.5; hct 26.9; mcv 90.3 plt 297 03-23-20: wbc 8.2; hgb 10.2; hct 32.0; mcv 90.7 plt 359  NO NEW LABS.     Review of Systems  Constitutional: Negative for malaise/fatigue.  Respiratory: Negative for cough and shortness of breath.   Cardiovascular: Negative for chest pain, palpitations and leg swelling.  Gastrointestinal: Negative for abdominal pain, constipation and heartburn.  Musculoskeletal: Negative for back pain, joint pain and myalgias.  Skin: Negative.   Neurological: Negative for dizziness.  Psychiatric/Behavioral: The patient is not nervous/anxious.  Physical Exam Constitutional:      General: She is not in acute distress.    Appearance: She is well-developed. She is not diaphoretic.  Eyes:     Comments: Macular degeneration   Neck:     Thyroid: No thyromegaly.  Cardiovascular:     Rate and Rhythm: Normal rate and regular rhythm.     Pulses: Normal pulses.     Heart sounds: Normal heart sounds.  Pulmonary:     Effort: Pulmonary effort is normal. No respiratory distress.     Breath sounds: Normal breath sounds.  Abdominal:     General: Bowel sounds are normal. There is no distension.     Palpations: Abdomen is soft.     Tenderness: There is no abdominal tenderness.  Musculoskeletal:     Cervical back: Neck supple.     Right lower leg: No edema.     Left lower leg: No edema.     Comments: Is able to move all extremities Status post left hip hemiarthroplasty 03-12-20       Lymphadenopathy:     Cervical: No cervical adenopathy.  Skin:    General: Skin is warm and dry.  Neurological:     Mental Status: She is alert and oriented to person, place, and time.  Psychiatric:        Mood and Affect: Mood normal.       ASSESSMENT/  PLAN:  TODAY  1. Closed fracture of left hip with delayed healing subsequent encounter: is stable will continue therapy as directed and will follow up with orthopedics; will continue lovenox 30 mg daily   2. Chronic constipation: is stable will continue senna s 2 tabs nightly dulcolax supp twice weekly   3. Hypokalemia: is stable will continue k+ 20 meq daily    PREVIOUS  4. GERD without esophagitis is stable will continue protonix 40 mg daily   5. Acute on chronic anemia: hgb 10.2 will monitor   6. PAF (paroxsymal atrial fibrillation): heart rate is stable will continue cardizem 30 mg twice daily for rate control not a candidate for anticoagulation therapy due to anemia.        MD is aware of resident's narcotic use and is in agreement with current plan of care. We will attempt to wean resident as appropriate.  Ok Edwards NP [facility]  Contact [phone] Monday through Friday 8am- 5pm  After hours call [phone]

## 2020-04-09 ENCOUNTER — Encounter: Payer: Self-pay | Admitting: Adult Health

## 2020-04-09 ENCOUNTER — Non-Acute Institutional Stay (SKILLED_NURSING_FACILITY): Payer: Medicare Other | Admitting: Adult Health

## 2020-04-09 DIAGNOSIS — D649 Anemia, unspecified: Secondary | ICD-10-CM

## 2020-04-09 DIAGNOSIS — I480 Paroxysmal atrial fibrillation: Secondary | ICD-10-CM

## 2020-04-09 DIAGNOSIS — K219 Gastro-esophageal reflux disease without esophagitis: Secondary | ICD-10-CM | POA: Diagnosis not present

## 2020-04-09 NOTE — Progress Notes (Signed)
Location:    [facility] Room Number: 128/P Place of Service:  SNF (31)   CODE STATUS: DNR  Allergies  Allergen Reactions  . Penicillins     unknown    Chief Complaint  Patient presents with  . Medical Management of Chronic Issues           GERD without esophagitis:   Acute on chronic anemia:   PAF (paroxysmal atrial fibrillation)     HPI:  She is a short term rehab patient being seen for the management of her chronic illnesses: gerd anemia; paf. There are no reports of uncontrolled pain; no constipation; no heart burn. She continues to participate in therapy.   Past Medical History:  Diagnosis Date  . A-fib ([place])   . Arthritis   . Chronic anemia   . GERD (gastroesophageal reflux disease)   . PAF (paroxysmal atrial fibrillation) (HCC)    No anticoagulation due to chronic anemia    Past Surgical History:  Procedure Laterality Date  . ABDOMINAL HYSTERECTOMY    . CATARACT EXTRACTION W/PHACO Right 01/04/2016   Procedure: CATARACT EXTRACTION PHACO AND INTRAOCULAR LENS PLACEMENT (IOC);  Surgeon: Tonny Branch, MD;  Location: [facility];  Service: Ophthalmology;  Laterality: Right;  CDE 9.06  . CATARACT EXTRACTION W/PHACO Left 01/21/2016   Procedure: CATARACT EXTRACTION PHACO AND INTRAOCULAR LENS PLACEMENT (IOC);  Surgeon: Tonny Branch, MD;  Location: [facility];  Service: Ophthalmology;  Laterality: Left;  CDE: 12.65  . HIP ARTHROPLASTY Left 03/12/2020   Procedure: ARTHROPLASTY BIPOLAR HIP (HEMIARTHROPLASTY);  Surgeon: Carole Civil, MD;  Location: [facility];  Service: Orthopedics;  Laterality: Left;  . TONSILLECTOMY      Social History   Socioeconomic History  . Marital status: Widowed    Spouse name: Not on file  . Number of children: Not on file  . Years of education: Not on file  . Highest education level: Not on file  Occupational History  . Not on file  Tobacco Use  . Smoking status: Never Smoker  . Smokeless tobacco: Never Used  Substance and  Sexual Activity  . Alcohol use: No  . Drug use: No  . Sexual activity: Never    Birth control/protection: Surgical  Other Topics Concern  . Not on file  Social History Narrative  . Not on file   Social Determinants of Health   Financial Resource Strain:   . Difficulty of Paying Living Expenses:   Food Insecurity:   . Worried About Charity fundraiser in the Last Year:   . Arboriculturist in the Last Year:   Transportation Needs:   . Film/video editor (Medical):   Marland Kitchen Lack of Transportation (Non-Medical):   Physical Activity:   . Days of Exercise per Week:   . Minutes of Exercise per Session:   Stress:   . Feeling of Stress :   Social Connections:   . Frequency of Communication with Friends and Family:   . Frequency of Social Gatherings with Friends and Family:   . Attends Religious Services:   . Active Member of Clubs or Organizations:   . Attends Archivist Meetings:   Marland Kitchen Marital Status:   Intimate Partner Violence:   . Fear of Current or Ex-Partner:   . Emotionally Abused:   Marland Kitchen Physically Abused:   . Sexually Abused:    Family History  Problem Relation Age of Onset  . Dementia Mother       VITAL SIGNS  BP 133/66   Pulse 76   Temp 97.9 F (36.6 C) (Oral)   Resp 20   Ht 5\' 1"  (1.549 m)   Wt 104 lb (47.2 kg)   BMI 19.65 kg/m   Outpatient Encounter Medications as of 04/09/2020  Medication Sig  . acetaminophen (TYLENOL) 325 MG tablet Take 650 mg by mouth every 6 (six) hours as needed for mild pain or moderate pain. ALL PILLS ARE CRUSHED-MIXED IN APPLESAUCE  . Balsam [place]-Castor Oil (VENELEX) OINT Apply topically. amt: 1 aplication; topical Special Instructions: Apply to sacrum and bilateral buttocks q shift for prevention. Every Shift Day, Evening, Night  . bisacodyl (DULCOLAX) 10 MG suppository Place 1 suppository (10 mg total) rectally See admin instructions. Every Monday and Friday only  . Calcium-Vitamin D-Vitamin K (VIACTIV CALCIUM PLUS D)  650-12.5-40 MG-MCG-MCG CHEW Chew 1 each by mouth in the morning and at bedtime.  Marland Kitchen diltiazem (CARDIZEM) 30 MG tablet Take 1 tablet (30 mg total) by mouth 2 (two) times daily. ALL PILLS ARE CRUSHED-MIXED IN APPLESAUCE  . enoxaparin (LOVENOX) 30 MG/0.3ML injection Inject 0.3 mLs (30 mg total) into the skin daily. For post left hip surgery DVT prophylaxis  . feeding supplement, ENSURE ENLIVE, (ENSURE ENLIVE) LIQD Take 237 mLs by mouth 2 (two) times daily between meals.  . Multiple Vitamins-Minerals (PRESERVISION AREDS 2) CHEW Chew 1 tablet by mouth in the morning and at bedtime.  . NON FORMULARY Diet: __x___ Regular, ______ NAS, _______Consistent Carbohydrate, _______NPO _____Other  . NON FORMULARY Diet - Liquids: __X_Regular; ___Thickened ___ Consistency: ___ Nectar, ___Honey, ___ Pudding; ____ Fluid Restriction  . ondansetron (ZOFRAN) 4 MG tablet Take 1 tablet (4 mg total) by mouth every 6 (six) hours as needed for nausea.  . pantoprazole (PROTONIX) 40 MG tablet Take 1 tablet (40 mg total) by mouth daily.  . potassium chloride 20 MEQ/15ML (10%) SOLN Take 20 mEq by mouth daily.  Marland Kitchen senna-docusate (SENOKOT-S) 8.6-50 MG tablet Take 2 tablets by mouth at bedtime.   No facility-administered encounter medications on file as of 04/09/2020.     SIGNIFICANT DIAGNOSTIC EXAMS  PREVIOUS  03-11-20: Chest x-ray: No active chest disease  Old left proximal humerus surgical neck fracture Aortic Atherosclerosis  03-11-20: left femur x-ray: Acute left hip subcapital femoral neck fracture. Osteopenia  03-13-20: 2-d echo:  Left ventricular ejection fraction, by estimation, is 60 to 65%. The left ventricle has normal function. The left ventricle has no regional  wall motion abnormalities. Left ventricular diastolic parameters were normal.   NO NEW EXAMS.   LABS REVIEWED PREVIOUS   03-11-20: wbc 6.2; hgb 9.6; hct 30.5 mcv 91.9 plt 293; glucose 116; bun 18; creat 0.71; k+ 3.9; na++ 137; ca 8.8 tsh  0.298 03-14-20: wbc 11.1; hgb 7.2; hct 22.7; mcv 91.2 plt 213;glucose 124; bun 16; creat 0.68; k+ 3.4; na++ 131; ca 7.4 03-15-20: wbc 7.3; hgb 8.5; hct 26.9; mcv 90.3 plt 297 03-23-20: wbc 8.2; hgb 10.2; hct 32.0; mcv 90.7 plt 359  NO NEW LABS.     Review of Systems  Constitutional: Negative for malaise/fatigue.  Respiratory: Negative for cough and shortness of breath.   Cardiovascular: Negative for chest pain, palpitations and leg swelling.  Gastrointestinal: Negative for abdominal pain, constipation and heartburn.  Musculoskeletal: Negative for back pain, joint pain and myalgias.  Skin: Negative.   Neurological: Negative for dizziness.  Psychiatric/Behavioral: The patient is not nervous/anxious.     Physical Exam Constitutional:      General: She is not in acute  distress.    Appearance: She is well-developed. She is not diaphoretic.  Eyes:     Comments: Macular degeneration   Neck:     Thyroid: No thyromegaly.  Cardiovascular:     Rate and Rhythm: Normal rate and regular rhythm.     Heart sounds: Normal heart sounds.  Pulmonary:     Effort: Pulmonary effort is normal. No respiratory distress.     Breath sounds: Normal breath sounds.  Abdominal:     General: Bowel sounds are normal. There is no distension.     Palpations: Abdomen is soft.     Tenderness: There is no abdominal tenderness.  Musculoskeletal:     Right lower leg: No edema.     Left lower leg: No edema.     Comments:  Is able to move all extremities Status post left hip hemiarthroplasty 03-12-20        Lymphadenopathy:     Cervical: No cervical adenopathy.  Skin:    General: Skin is warm and dry.  Neurological:     Mental Status: She is alert and oriented to person, place, and time.  Psychiatric:        Mood and Affect: Mood normal.        ASSESSMENT/ PLAN:  TODAY  1. GERD without esophagitis: is stable will continue protonix 40 mg daily   2. Acute on chronic anemia: hgb 10.2 will monitor  3.  PAF (paroxysmal atrial fibrillation) heart rate is stable will continue cardizem 30 mg twice daily for rate control not a candidate for anticoagulation therapy due to anemia.   PREVIOUS  4. Closed fracture of left hip with delayed healing subsequent encounter: is stable will continue therapy as directed and will follow up with orthopedics; will continue lovenox 30 mg daily   5. Chronic constipation: is stable will continue senna s 2 tabs nightly dulcolax supp twice weekly   6. Hypokalemia: is stable will continue k+ 20 meq daily     MD is aware of resident's narcotic use and is in agreement with current plan of care. We will attempt to wean resident as appropriate.  Ok Edwards NP [facility]  Contact [phone] Monday through Friday 8am- 5pm  After hours call [phone]

## 2020-04-10 ENCOUNTER — Other Ambulatory Visit: Payer: Self-pay | Admitting: Adult Health

## 2020-04-10 ENCOUNTER — Non-Acute Institutional Stay (SKILLED_NURSING_FACILITY): Payer: Medicare Other | Admitting: Adult Health

## 2020-04-10 ENCOUNTER — Encounter: Payer: Self-pay | Admitting: Adult Health

## 2020-04-10 DIAGNOSIS — S72002G Fracture of unspecified part of neck of left femur, subsequent encounter for closed fracture with delayed healing: Secondary | ICD-10-CM

## 2020-04-10 DIAGNOSIS — I480 Paroxysmal atrial fibrillation: Secondary | ICD-10-CM | POA: Diagnosis not present

## 2020-04-10 DIAGNOSIS — Z96642 Presence of left artificial hip joint: Secondary | ICD-10-CM | POA: Diagnosis not present

## 2020-04-10 DIAGNOSIS — D649 Anemia, unspecified: Secondary | ICD-10-CM

## 2020-04-10 MED ORDER — ONDANSETRON HCL 4 MG PO TABS: 4.0000 mg | ORAL_TABLET | Freq: Four times a day (QID) | ORAL | 0 refills | Status: DC | PRN

## 2020-04-10 MED ORDER — POTASSIUM CHLORIDE 20 MEQ/15ML (10%) PO SOLN: 20.0000 meq | Freq: Every day | ORAL | 0 refills | Status: DC

## 2020-04-10 MED ORDER — PANTOPRAZOLE SODIUM 40 MG PO TBEC: 40.0000 mg | DELAYED_RELEASE_TABLET | Freq: Every day | ORAL | 0 refills | Status: DC

## 2020-04-10 MED ORDER — DILTIAZEM HCL 30 MG PO TABS: 30.0000 mg | ORAL_TABLET | Freq: Two times a day (BID) | ORAL | 0 refills | Status: DC

## 2020-04-10 NOTE — Progress Notes (Signed)
Location:    [facility] Room Number: 128/P Place of Service:  SNF (31)    CODE STATUS: DNR  Allergies  Allergen Reactions  . Penicillins     unknown    Chief Complaint  Patient presents with  . Discharge Note    Discharge Visit    HPI:  She is being discharged to home with home health for pt/ot. She will need a wheelchair. She will need her prescriptions written and will need to follow up with her medical provider. She had been hospitalized for a left hip fracture. She was admitted to this facility for short term rehab. She has participated pt/ot while in the facility. She is ready to complete her therapy on a home health basis. She does have help at home.    Past Medical History:  Diagnosis Date  . A-fib ([facility])   . Arthritis   . Chronic anemia   . GERD (gastroesophageal reflux disease)   . PAF (paroxysmal atrial fibrillation) (H[facility])    No anticoagulation due to chronic anemia    Past Surgical History:  Procedure Laterality Date  . ABDOMINAL HYSTERECTOMY    . CATARACT EXTRACTION W/PHACO Right 01/04/2016   Procedure: CATARACT EXTRACTION PHACO AND INTRAOCULAR LENS PLACEMENT (IOC);  Surgeon: Tonny Branch, MD;  Location: [facility];  Service: Ophthalmology;  Laterality: Right;  CDE 9.06  . CATARACT EXTRACTION W/PHACO Left 01/21/2016   Procedure: CATARACT EXTRACTION PHACO AND INTRAOCULAR LENS PLACEMENT (IOC);  Surgeon: Tonny Branch, MD;  Location: [facility];  Service: Ophthalmology;  Laterality: Left;  CDE: 12.65  . HIP ARTHROPLASTY Left 03/12/2020   Procedure: ARTHROPLASTY BIPOLAR HIP (HEMIARTHROPLASTY);  Surgeon: Carole Civil, MD;  Location: [facility];  Service: Orthopedics;  Laterality: Left;  . TONSILLECTOMY      Social History   Socioeconomic History  . Marital status: Widowed    Spouse name: Not on file  . Number of children: Not on file  . Years of education: Not on file  . Highest education level: Not on file  Occupational History  . Not on file    Tobacco Use  . Smoking status: Never Smoker  . Smokeless tobacco: Never Used  Substance and Sexual Activity  . Alcohol use: No  . Drug use: No  . Sexual activity: Never    Birth control/protection: Surgical  Other Topics Concern  . Not on file  Social History Narrative  . Not on file   Social Determinants of Health   Financial Resource Strain:   . Difficulty of Paying Living Expenses:   Food Insecurity:   . Worried About Charity fundraiser in the Last Year:   . Arboriculturist in the Last Year:   Transportation Needs:   . Film/video editor (Medical):   Marland Kitchen Lack of Transportation (Non-Medical):   Physical Activity:   . Days of Exercise per Week:   . Minutes of Exercise per Session:   Stress:   . Feeling of Stress :   Social Connections:   . Frequency of Communication with Friends and Family:   . Frequency of Social Gatherings with Friends and Family:   . Attends Religious Services:   . Active Member of Clubs or Organizations:   . Attends Archivist Meetings:   Marland Kitchen Marital Status:   Intimate Partner Violence:   . Fear of Current or Ex-Partner:   . Emotionally Abused:   Marland Kitchen Physically Abused:   . Sexually Abused:    Family  History  Problem Relation Age of Onset  . Dementia Mother     VITAL SIGNS BP 133/66   Pulse 76   Temp (!) 97.3 F (36.3 C) (Oral)   Resp 20   Ht 5\' 1"  (1.549 m)   Wt 104 lb (47.2 kg)   BMI 19.65 kg/m   Patient's Medications  New Prescriptions   No medications on file  Previous Medications   ACETAMINOPHEN (TYLENOL) 325 MG TABLET    Take 650 mg by mouth every 6 (six) hours as needed for mild pain or moderate pain. ALL PILLS ARE CRUSHED-MIXED IN APPLESAUCE   BALSAM PERU-CASTOR OIL (VENELEX) OINT    Apply topically. amt: 1 aplication; topical Special Instructions: Apply to sacrum and bilateral buttocks q shift for prevention. Every Shift Day, Evening, Night   BISACODYL (DULCOLAX) 10 MG SUPPOSITORY    Place 1 suppository (10 mg  total) rectally See admin instructions. Every Monday and Friday only   CALCIUM-VITAMIN D-VITAMIN K (VIACTIV CALCIUM PLUS D) 650-12.5-40 MG-MCG-MCG CHEW    Chew 1 each by mouth in the morning and at bedtime.   DILTIAZEM (CARDIZEM) 30 MG TABLET    Take 1 tablet (30 mg total) by mouth 2 (two) times daily. ALL PILLS ARE CRUSHED-MIXED IN APPLESAUCE   ENOXAPARIN (LOVENOX) 30 MG/0.3ML INJECTION    Inject 0.3 mLs (30 mg total) into the skin daily. For post left hip surgery DVT prophylaxis   FEEDING SUPPLEMENT, ENSURE ENLIVE, (ENSURE ENLIVE) LIQD    Take 237 mLs by mouth 2 (two) times daily between meals.   MULTIPLE VITAMINS-MINERALS (PRESERVISION AREDS 2) CHEW    Chew 1 tablet by mouth in the morning and at bedtime.   NON FORMULARY    Diet: __x___ Regular, ______ NAS, _______Consistent Carbohydrate, _______NPO _____Other   NON FORMULARY    Diet - Liquids: __X_Regular; ___Thickened ___ Consistency: ___ Nectar, ___Honey, ___ Pudding; ____ Fluid Restriction   ONDANSETRON (ZOFRAN) 4 MG TABLET    Take 1 tablet (4 mg total) by mouth every 6 (six) hours as needed for nausea.   PANTOPRAZOLE (PROTONIX) 40 MG TABLET    Take 1 tablet (40 mg total) by mouth daily.   POTASSIUM CHLORIDE 20 MEQ/15ML (10%) SOLN    Take 20 mEq by mouth daily.   SENNA-DOCUSATE (SENOKOT-S) 8.6-50 MG TABLET    Take 2 tablets by mouth at bedtime.  Modified Medications   No medications on file  Discontinued Medications   No medications on file     SIGNIFICANT DIAGNOSTIC EXAMS   PREVIOUS  03-11-20: Chest x-ray: No active chest disease  Old left proximal humerus surgical neck fracture Aortic Atherosclerosis  03-11-20: left femur x-ray: Acute left hip subcapital femoral neck fracture. Osteopenia  03-13-20: 2-d echo:  Left ventricular ejection fraction, by estimation, is 60 to 65%. The left ventricle has normal function. The left ventricle has no regional  wall motion abnormalities. Left ventricular diastolic parameters were normal.    NO NEW EXAMS.   LABS REVIEWED PREVIOUS   03-11-20: wbc 6.2; hgb 9.6; hct 30.5 mcv 91.9 plt 293; glucose 116; bun 18; creat 0.71; k+ 3.9; na++ 137; ca 8.8 tsh 0.298 03-14-20: wbc 11.1; hgb 7.2; hct 22.7; mcv 91.2 plt 213;glucose 124; bun 16; creat 0.68; k+ 3.4; na++ 131; ca 7.4 03-15-20: wbc 7.3; hgb 8.5; hct 26.9; mcv 90.3 plt 297 03-23-20: wbc 8.2; hgb 10.2; hct 32.0; mcv 90.7 plt 359  NO NEW LABS.    Review of Systems  Constitutional: Negative for malaise/fatigue.  Respiratory: Negative for cough and shortness of breath.   Cardiovascular: Negative for chest pain, palpitations and leg swelling.  Gastrointestinal: Negative for abdominal pain, constipation and heartburn.  Musculoskeletal: Negative for back pain, joint pain and myalgias.  Skin: Negative.   Neurological: Negative for dizziness.  Psychiatric/Behavioral: The patient is not nervous/anxious.     Physical Exam Constitutional:      General: She is not in acute distress.    Appearance: She is well-developed. She is not diaphoretic.  Eyes:     Comments: Macular degeneration    Neck:     Thyroid: No thyromegaly.  Cardiovascular:     Rate and Rhythm: Normal rate and regular rhythm.     Pulses: Normal pulses.     Heart sounds: Normal heart sounds.  Pulmonary:     Effort: Pulmonary effort is normal. No respiratory distress.     Breath sounds: Normal breath sounds.  Abdominal:     General: Bowel sounds are normal. There is no distension.     Palpations: Abdomen is soft.     Tenderness: There is no abdominal tenderness.  Musculoskeletal:     Cervical back: Neck supple.     Right lower leg: No edema.     Left lower leg: No edema.     Comments: Is able to move all extremities Status post left hip hemiarthroplasty 03-12-20         Lymphadenopathy:     Cervical: No cervical adenopathy.  Skin:    General: Skin is warm and dry.  Neurological:     Mental Status: She is alert and oriented to person, place, and time.   Psychiatric:        Mood and Affect: Mood normal.       ASSESSMENT/ PLAN:  Patient is being discharged with the following home health services:  Pt/ot to evaluate and treat as indicated for gait balance strength adl training.   Patient is being discharged with the following durable medical equipment:  Standard wheelchair with elevated leg rests; cushion; anti-tippers; brake extensions. To allow her to maintain her current level of independence with her adls which cannot be achieved with a walker; cane crutches; is able to propel wheelchair   Patient has been advised to f/u with their PCP in 1-2 weeks to bring them up to date on their rehab stay.  Social services at facility was responsible for arranging this appointment.  Pt was provided with a 30 day supply of prescriptions for medications and refills must be obtained from their PCP.  For controlled substances, a more limited supply may be provided adequate until PCP appointment only.  A 30 day supply of her prescription medications have been sent to [facility] in [place].   Time spent with patient: 45 minutes: dme; medications home health needs.     Ok Edwards NP [facility]  Contact [phone] Monday through Friday 8am- 5pm  After hours call [phone]

## 2020-04-13 ENCOUNTER — Encounter (INDEPENDENT_AMBULATORY_CARE_PROVIDER_SITE_OTHER): Payer: Medicare Other | Admitting: Ophthalmology

## 2020-04-13 DIAGNOSIS — H26491 Other secondary cataract, right eye: Secondary | ICD-10-CM

## 2020-04-13 DIAGNOSIS — H43813 Vitreous degeneration, bilateral: Secondary | ICD-10-CM

## 2020-04-13 DIAGNOSIS — H353134 Nonexudative age-related macular degeneration, bilateral, advanced atrophic with subfoveal involvement: Secondary | ICD-10-CM | POA: Diagnosis not present

## 2020-04-18 DIAGNOSIS — M1990 Unspecified osteoarthritis, unspecified site: Secondary | ICD-10-CM | POA: Diagnosis not present

## 2020-04-18 DIAGNOSIS — I480 Paroxysmal atrial fibrillation: Secondary | ICD-10-CM | POA: Diagnosis not present

## 2020-04-18 DIAGNOSIS — Z9181 History of falling: Secondary | ICD-10-CM | POA: Diagnosis not present

## 2020-04-18 DIAGNOSIS — K219 Gastro-esophageal reflux disease without esophagitis: Secondary | ICD-10-CM | POA: Diagnosis not present

## 2020-04-18 DIAGNOSIS — L89620 Pressure ulcer of left heel, unstageable: Secondary | ICD-10-CM | POA: Diagnosis not present

## 2020-04-18 DIAGNOSIS — Z8781 Personal history of (healed) traumatic fracture: Secondary | ICD-10-CM | POA: Diagnosis not present

## 2020-04-18 DIAGNOSIS — W010XXD Fall on same level from slipping, tripping and stumbling without subsequent striking against object, subsequent encounter: Secondary | ICD-10-CM | POA: Diagnosis not present

## 2020-04-18 DIAGNOSIS — D649 Anemia, unspecified: Secondary | ICD-10-CM | POA: Diagnosis not present

## 2020-04-18 DIAGNOSIS — S72002D Fracture of unspecified part of neck of left femur, subsequent encounter for closed fracture with routine healing: Secondary | ICD-10-CM | POA: Diagnosis not present

## 2020-04-18 DIAGNOSIS — Z96642 Presence of left artificial hip joint: Secondary | ICD-10-CM | POA: Diagnosis not present

## 2020-04-20 ENCOUNTER — Telehealth: Payer: Self-pay | Admitting: Orthopedic Surgery

## 2020-04-20 NOTE — Telephone Encounter (Signed)
Call per voice message from physical therapist Verl Dicker, [place], requesting orders per evaluation over this weekend for: 1 time a week x 1 week 2 times a week x4 weeks Also requesting to add nursing to the order regarding pressure wound - states patient's primary care is Dr Sherrie Sport, who may manage nursing after initial orders Ph# [phone] leave secure message if not available when calling

## 2020-04-21 NOTE — Telephone Encounter (Signed)
I called gave verbal order for the physical therapy, but advised Dr Aline Brochure does not order wound care. Her Primary care will need to advise on those orders  To you FYI

## 2020-04-22 DIAGNOSIS — Z8781 Personal history of (healed) traumatic fracture: Secondary | ICD-10-CM | POA: Diagnosis not present

## 2020-04-22 DIAGNOSIS — L89620 Pressure ulcer of left heel, unstageable: Secondary | ICD-10-CM | POA: Diagnosis not present

## 2020-04-22 DIAGNOSIS — W010XXD Fall on same level from slipping, tripping and stumbling without subsequent striking against object, subsequent encounter: Secondary | ICD-10-CM | POA: Diagnosis not present

## 2020-04-22 DIAGNOSIS — S72002D Fracture of unspecified part of neck of left femur, subsequent encounter for closed fracture with routine healing: Secondary | ICD-10-CM | POA: Diagnosis not present

## 2020-04-22 DIAGNOSIS — Z9181 History of falling: Secondary | ICD-10-CM | POA: Diagnosis not present

## 2020-04-22 DIAGNOSIS — D649 Anemia, unspecified: Secondary | ICD-10-CM | POA: Diagnosis not present

## 2020-04-22 DIAGNOSIS — Z96642 Presence of left artificial hip joint: Secondary | ICD-10-CM | POA: Diagnosis not present

## 2020-04-22 DIAGNOSIS — I480 Paroxysmal atrial fibrillation: Secondary | ICD-10-CM | POA: Diagnosis not present

## 2020-04-22 DIAGNOSIS — M1990 Unspecified osteoarthritis, unspecified site: Secondary | ICD-10-CM | POA: Diagnosis not present

## 2020-04-22 DIAGNOSIS — K219 Gastro-esophageal reflux disease without esophagitis: Secondary | ICD-10-CM | POA: Diagnosis not present

## 2020-04-23 DIAGNOSIS — L89620 Pressure ulcer of left heel, unstageable: Secondary | ICD-10-CM | POA: Diagnosis not present

## 2020-04-23 DIAGNOSIS — M1990 Unspecified osteoarthritis, unspecified site: Secondary | ICD-10-CM | POA: Diagnosis not present

## 2020-04-23 DIAGNOSIS — D649 Anemia, unspecified: Secondary | ICD-10-CM | POA: Diagnosis not present

## 2020-04-23 DIAGNOSIS — Z9181 History of falling: Secondary | ICD-10-CM | POA: Diagnosis not present

## 2020-04-23 DIAGNOSIS — Z96642 Presence of left artificial hip joint: Secondary | ICD-10-CM | POA: Diagnosis not present

## 2020-04-23 DIAGNOSIS — W010XXD Fall on same level from slipping, tripping and stumbling without subsequent striking against object, subsequent encounter: Secondary | ICD-10-CM | POA: Diagnosis not present

## 2020-04-23 DIAGNOSIS — I480 Paroxysmal atrial fibrillation: Secondary | ICD-10-CM | POA: Diagnosis not present

## 2020-04-23 DIAGNOSIS — K219 Gastro-esophageal reflux disease without esophagitis: Secondary | ICD-10-CM | POA: Diagnosis not present

## 2020-04-23 DIAGNOSIS — S72002D Fracture of unspecified part of neck of left femur, subsequent encounter for closed fracture with routine healing: Secondary | ICD-10-CM | POA: Diagnosis not present

## 2020-04-23 DIAGNOSIS — Z8781 Personal history of (healed) traumatic fracture: Secondary | ICD-10-CM | POA: Diagnosis not present

## 2020-04-24 ENCOUNTER — Telehealth: Payer: Self-pay | Admitting: Orthopedic Surgery

## 2020-04-24 DIAGNOSIS — K219 Gastro-esophageal reflux disease without esophagitis: Secondary | ICD-10-CM | POA: Diagnosis not present

## 2020-04-24 DIAGNOSIS — Z8781 Personal history of (healed) traumatic fracture: Secondary | ICD-10-CM | POA: Diagnosis not present

## 2020-04-24 DIAGNOSIS — D649 Anemia, unspecified: Secondary | ICD-10-CM | POA: Diagnosis not present

## 2020-04-24 DIAGNOSIS — L89620 Pressure ulcer of left heel, unstageable: Secondary | ICD-10-CM | POA: Diagnosis not present

## 2020-04-24 DIAGNOSIS — I480 Paroxysmal atrial fibrillation: Secondary | ICD-10-CM | POA: Diagnosis not present

## 2020-04-24 DIAGNOSIS — Z96642 Presence of left artificial hip joint: Secondary | ICD-10-CM | POA: Diagnosis not present

## 2020-04-24 DIAGNOSIS — W010XXD Fall on same level from slipping, tripping and stumbling without subsequent striking against object, subsequent encounter: Secondary | ICD-10-CM | POA: Diagnosis not present

## 2020-04-24 DIAGNOSIS — Z9181 History of falling: Secondary | ICD-10-CM | POA: Diagnosis not present

## 2020-04-24 DIAGNOSIS — M1990 Unspecified osteoarthritis, unspecified site: Secondary | ICD-10-CM | POA: Diagnosis not present

## 2020-04-24 DIAGNOSIS — S72002D Fracture of unspecified part of neck of left femur, subsequent encounter for closed fracture with routine healing: Secondary | ICD-10-CM | POA: Diagnosis not present

## 2020-04-24 NOTE — Telephone Encounter (Signed)
I called with verbal orders

## 2020-04-24 NOTE — Telephone Encounter (Signed)
Call received from occupational therapist, Legrand Como, [facility], requesting verbal orders for occupational therapy: 1 time a week x1 week 2 times a week x4 weeks Please call/may leave message on confidential voice mail at ph# [phone].

## 2020-04-27 DIAGNOSIS — Z96642 Presence of left artificial hip joint: Secondary | ICD-10-CM | POA: Diagnosis not present

## 2020-04-27 DIAGNOSIS — I480 Paroxysmal atrial fibrillation: Secondary | ICD-10-CM | POA: Diagnosis not present

## 2020-04-27 DIAGNOSIS — Z9181 History of falling: Secondary | ICD-10-CM | POA: Diagnosis not present

## 2020-04-27 DIAGNOSIS — S72002D Fracture of unspecified part of neck of left femur, subsequent encounter for closed fracture with routine healing: Secondary | ICD-10-CM | POA: Diagnosis not present

## 2020-04-27 DIAGNOSIS — W010XXD Fall on same level from slipping, tripping and stumbling without subsequent striking against object, subsequent encounter: Secondary | ICD-10-CM | POA: Diagnosis not present

## 2020-04-27 DIAGNOSIS — K219 Gastro-esophageal reflux disease without esophagitis: Secondary | ICD-10-CM | POA: Diagnosis not present

## 2020-04-27 DIAGNOSIS — Z8781 Personal history of (healed) traumatic fracture: Secondary | ICD-10-CM | POA: Diagnosis not present

## 2020-04-27 DIAGNOSIS — M1990 Unspecified osteoarthritis, unspecified site: Secondary | ICD-10-CM | POA: Diagnosis not present

## 2020-04-27 DIAGNOSIS — D649 Anemia, unspecified: Secondary | ICD-10-CM | POA: Diagnosis not present

## 2020-04-27 DIAGNOSIS — L89620 Pressure ulcer of left heel, unstageable: Secondary | ICD-10-CM | POA: Diagnosis not present

## 2020-04-28 DIAGNOSIS — Z8781 Personal history of (healed) traumatic fracture: Secondary | ICD-10-CM | POA: Diagnosis not present

## 2020-04-28 DIAGNOSIS — Z9181 History of falling: Secondary | ICD-10-CM | POA: Diagnosis not present

## 2020-04-28 DIAGNOSIS — D649 Anemia, unspecified: Secondary | ICD-10-CM | POA: Diagnosis not present

## 2020-04-28 DIAGNOSIS — M1990 Unspecified osteoarthritis, unspecified site: Secondary | ICD-10-CM | POA: Diagnosis not present

## 2020-04-28 DIAGNOSIS — W010XXD Fall on same level from slipping, tripping and stumbling without subsequent striking against object, subsequent encounter: Secondary | ICD-10-CM | POA: Diagnosis not present

## 2020-04-28 DIAGNOSIS — K219 Gastro-esophageal reflux disease without esophagitis: Secondary | ICD-10-CM | POA: Diagnosis not present

## 2020-04-28 DIAGNOSIS — Z96642 Presence of left artificial hip joint: Secondary | ICD-10-CM | POA: Diagnosis not present

## 2020-04-28 DIAGNOSIS — L89620 Pressure ulcer of left heel, unstageable: Secondary | ICD-10-CM | POA: Diagnosis not present

## 2020-04-28 DIAGNOSIS — I480 Paroxysmal atrial fibrillation: Secondary | ICD-10-CM | POA: Diagnosis not present

## 2020-04-28 DIAGNOSIS — S72002D Fracture of unspecified part of neck of left femur, subsequent encounter for closed fracture with routine healing: Secondary | ICD-10-CM | POA: Diagnosis not present

## 2020-04-29 DIAGNOSIS — S72002D Fracture of unspecified part of neck of left femur, subsequent encounter for closed fracture with routine healing: Secondary | ICD-10-CM | POA: Diagnosis not present

## 2020-04-29 DIAGNOSIS — I480 Paroxysmal atrial fibrillation: Secondary | ICD-10-CM | POA: Diagnosis not present

## 2020-04-29 DIAGNOSIS — K219 Gastro-esophageal reflux disease without esophagitis: Secondary | ICD-10-CM | POA: Diagnosis not present

## 2020-04-29 DIAGNOSIS — D649 Anemia, unspecified: Secondary | ICD-10-CM | POA: Diagnosis not present

## 2020-04-29 DIAGNOSIS — Z8781 Personal history of (healed) traumatic fracture: Secondary | ICD-10-CM | POA: Diagnosis not present

## 2020-04-29 DIAGNOSIS — W010XXD Fall on same level from slipping, tripping and stumbling without subsequent striking against object, subsequent encounter: Secondary | ICD-10-CM | POA: Diagnosis not present

## 2020-04-29 DIAGNOSIS — L89620 Pressure ulcer of left heel, unstageable: Secondary | ICD-10-CM | POA: Diagnosis not present

## 2020-04-29 DIAGNOSIS — M1990 Unspecified osteoarthritis, unspecified site: Secondary | ICD-10-CM | POA: Diagnosis not present

## 2020-04-29 DIAGNOSIS — Z9181 History of falling: Secondary | ICD-10-CM | POA: Diagnosis not present

## 2020-04-29 DIAGNOSIS — Z96642 Presence of left artificial hip joint: Secondary | ICD-10-CM | POA: Diagnosis not present

## 2020-04-30 DIAGNOSIS — Z6822 Body mass index (BMI) 22.0-22.9, adult: Secondary | ICD-10-CM | POA: Diagnosis not present

## 2020-04-30 DIAGNOSIS — M8000XD Age-related osteoporosis with current pathological fracture, unspecified site, subsequent encounter for fracture with routine healing: Secondary | ICD-10-CM | POA: Diagnosis not present

## 2020-04-30 DIAGNOSIS — I10 Essential (primary) hypertension: Secondary | ICD-10-CM | POA: Diagnosis not present

## 2020-05-01 DIAGNOSIS — D649 Anemia, unspecified: Secondary | ICD-10-CM | POA: Diagnosis not present

## 2020-05-01 DIAGNOSIS — L89620 Pressure ulcer of left heel, unstageable: Secondary | ICD-10-CM | POA: Diagnosis not present

## 2020-05-01 DIAGNOSIS — Z8781 Personal history of (healed) traumatic fracture: Secondary | ICD-10-CM | POA: Diagnosis not present

## 2020-05-01 DIAGNOSIS — S72002D Fracture of unspecified part of neck of left femur, subsequent encounter for closed fracture with routine healing: Secondary | ICD-10-CM | POA: Diagnosis not present

## 2020-05-01 DIAGNOSIS — I480 Paroxysmal atrial fibrillation: Secondary | ICD-10-CM | POA: Diagnosis not present

## 2020-05-01 DIAGNOSIS — W010XXD Fall on same level from slipping, tripping and stumbling without subsequent striking against object, subsequent encounter: Secondary | ICD-10-CM | POA: Diagnosis not present

## 2020-05-01 DIAGNOSIS — M1990 Unspecified osteoarthritis, unspecified site: Secondary | ICD-10-CM | POA: Diagnosis not present

## 2020-05-01 DIAGNOSIS — Z96642 Presence of left artificial hip joint: Secondary | ICD-10-CM | POA: Diagnosis not present

## 2020-05-01 DIAGNOSIS — K219 Gastro-esophageal reflux disease without esophagitis: Secondary | ICD-10-CM | POA: Diagnosis not present

## 2020-05-01 DIAGNOSIS — Z9181 History of falling: Secondary | ICD-10-CM | POA: Diagnosis not present

## 2020-05-04 DIAGNOSIS — L03032 Cellulitis of left toe: Secondary | ICD-10-CM | POA: Diagnosis not present

## 2020-05-04 DIAGNOSIS — Z8781 Personal history of (healed) traumatic fracture: Secondary | ICD-10-CM | POA: Diagnosis not present

## 2020-05-04 DIAGNOSIS — Z9181 History of falling: Secondary | ICD-10-CM | POA: Diagnosis not present

## 2020-05-04 DIAGNOSIS — S72002D Fracture of unspecified part of neck of left femur, subsequent encounter for closed fracture with routine healing: Secondary | ICD-10-CM | POA: Diagnosis not present

## 2020-05-04 DIAGNOSIS — K219 Gastro-esophageal reflux disease without esophagitis: Secondary | ICD-10-CM | POA: Diagnosis not present

## 2020-05-04 DIAGNOSIS — L03031 Cellulitis of right toe: Secondary | ICD-10-CM | POA: Diagnosis not present

## 2020-05-04 DIAGNOSIS — D649 Anemia, unspecified: Secondary | ICD-10-CM | POA: Diagnosis not present

## 2020-05-04 DIAGNOSIS — Z96642 Presence of left artificial hip joint: Secondary | ICD-10-CM | POA: Diagnosis not present

## 2020-05-04 DIAGNOSIS — M79671 Pain in right foot: Secondary | ICD-10-CM | POA: Diagnosis not present

## 2020-05-04 DIAGNOSIS — I739 Peripheral vascular disease, unspecified: Secondary | ICD-10-CM | POA: Diagnosis not present

## 2020-05-04 DIAGNOSIS — L89620 Pressure ulcer of left heel, unstageable: Secondary | ICD-10-CM | POA: Diagnosis not present

## 2020-05-04 DIAGNOSIS — I480 Paroxysmal atrial fibrillation: Secondary | ICD-10-CM | POA: Diagnosis not present

## 2020-05-04 DIAGNOSIS — W010XXD Fall on same level from slipping, tripping and stumbling without subsequent striking against object, subsequent encounter: Secondary | ICD-10-CM | POA: Diagnosis not present

## 2020-05-04 DIAGNOSIS — M1990 Unspecified osteoarthritis, unspecified site: Secondary | ICD-10-CM | POA: Diagnosis not present

## 2020-05-04 DIAGNOSIS — L600 Ingrowing nail: Secondary | ICD-10-CM | POA: Diagnosis not present

## 2020-05-06 ENCOUNTER — Encounter (INDEPENDENT_AMBULATORY_CARE_PROVIDER_SITE_OTHER): Payer: Medicare Other | Admitting: Ophthalmology

## 2020-05-11 DIAGNOSIS — Z96642 Presence of left artificial hip joint: Secondary | ICD-10-CM | POA: Diagnosis not present

## 2020-05-11 DIAGNOSIS — W010XXD Fall on same level from slipping, tripping and stumbling without subsequent striking against object, subsequent encounter: Secondary | ICD-10-CM | POA: Diagnosis not present

## 2020-05-11 DIAGNOSIS — I480 Paroxysmal atrial fibrillation: Secondary | ICD-10-CM | POA: Diagnosis not present

## 2020-05-11 DIAGNOSIS — K219 Gastro-esophageal reflux disease without esophagitis: Secondary | ICD-10-CM | POA: Diagnosis not present

## 2020-05-11 DIAGNOSIS — L89620 Pressure ulcer of left heel, unstageable: Secondary | ICD-10-CM | POA: Diagnosis not present

## 2020-05-11 DIAGNOSIS — M1990 Unspecified osteoarthritis, unspecified site: Secondary | ICD-10-CM | POA: Diagnosis not present

## 2020-05-11 DIAGNOSIS — D649 Anemia, unspecified: Secondary | ICD-10-CM | POA: Diagnosis not present

## 2020-05-11 DIAGNOSIS — S72002D Fracture of unspecified part of neck of left femur, subsequent encounter for closed fracture with routine healing: Secondary | ICD-10-CM | POA: Diagnosis not present

## 2020-05-11 DIAGNOSIS — Z8781 Personal history of (healed) traumatic fracture: Secondary | ICD-10-CM | POA: Diagnosis not present

## 2020-05-11 DIAGNOSIS — Z9181 History of falling: Secondary | ICD-10-CM | POA: Diagnosis not present

## 2020-05-12 DIAGNOSIS — S72002D Fracture of unspecified part of neck of left femur, subsequent encounter for closed fracture with routine healing: Secondary | ICD-10-CM | POA: Diagnosis not present

## 2020-05-12 DIAGNOSIS — K219 Gastro-esophageal reflux disease without esophagitis: Secondary | ICD-10-CM | POA: Diagnosis not present

## 2020-05-12 DIAGNOSIS — I480 Paroxysmal atrial fibrillation: Secondary | ICD-10-CM | POA: Diagnosis not present

## 2020-05-12 DIAGNOSIS — Z9181 History of falling: Secondary | ICD-10-CM | POA: Diagnosis not present

## 2020-05-12 DIAGNOSIS — W010XXD Fall on same level from slipping, tripping and stumbling without subsequent striking against object, subsequent encounter: Secondary | ICD-10-CM | POA: Diagnosis not present

## 2020-05-12 DIAGNOSIS — Z8781 Personal history of (healed) traumatic fracture: Secondary | ICD-10-CM | POA: Diagnosis not present

## 2020-05-12 DIAGNOSIS — D649 Anemia, unspecified: Secondary | ICD-10-CM | POA: Diagnosis not present

## 2020-05-12 DIAGNOSIS — Z96642 Presence of left artificial hip joint: Secondary | ICD-10-CM | POA: Diagnosis not present

## 2020-05-12 DIAGNOSIS — L89620 Pressure ulcer of left heel, unstageable: Secondary | ICD-10-CM | POA: Diagnosis not present

## 2020-05-12 DIAGNOSIS — M1990 Unspecified osteoarthritis, unspecified site: Secondary | ICD-10-CM | POA: Diagnosis not present

## 2020-05-12 IMAGING — DX DG SHOULDER 2+V*L*
2 series · 2 of 2 positions shown · non-contrast
Comparison: None.

CLINICAL DATA: Left shoulder pain after fall.

EXAM:
LEFT SHOULDER - 2+ VIEW

[shoulder y view]
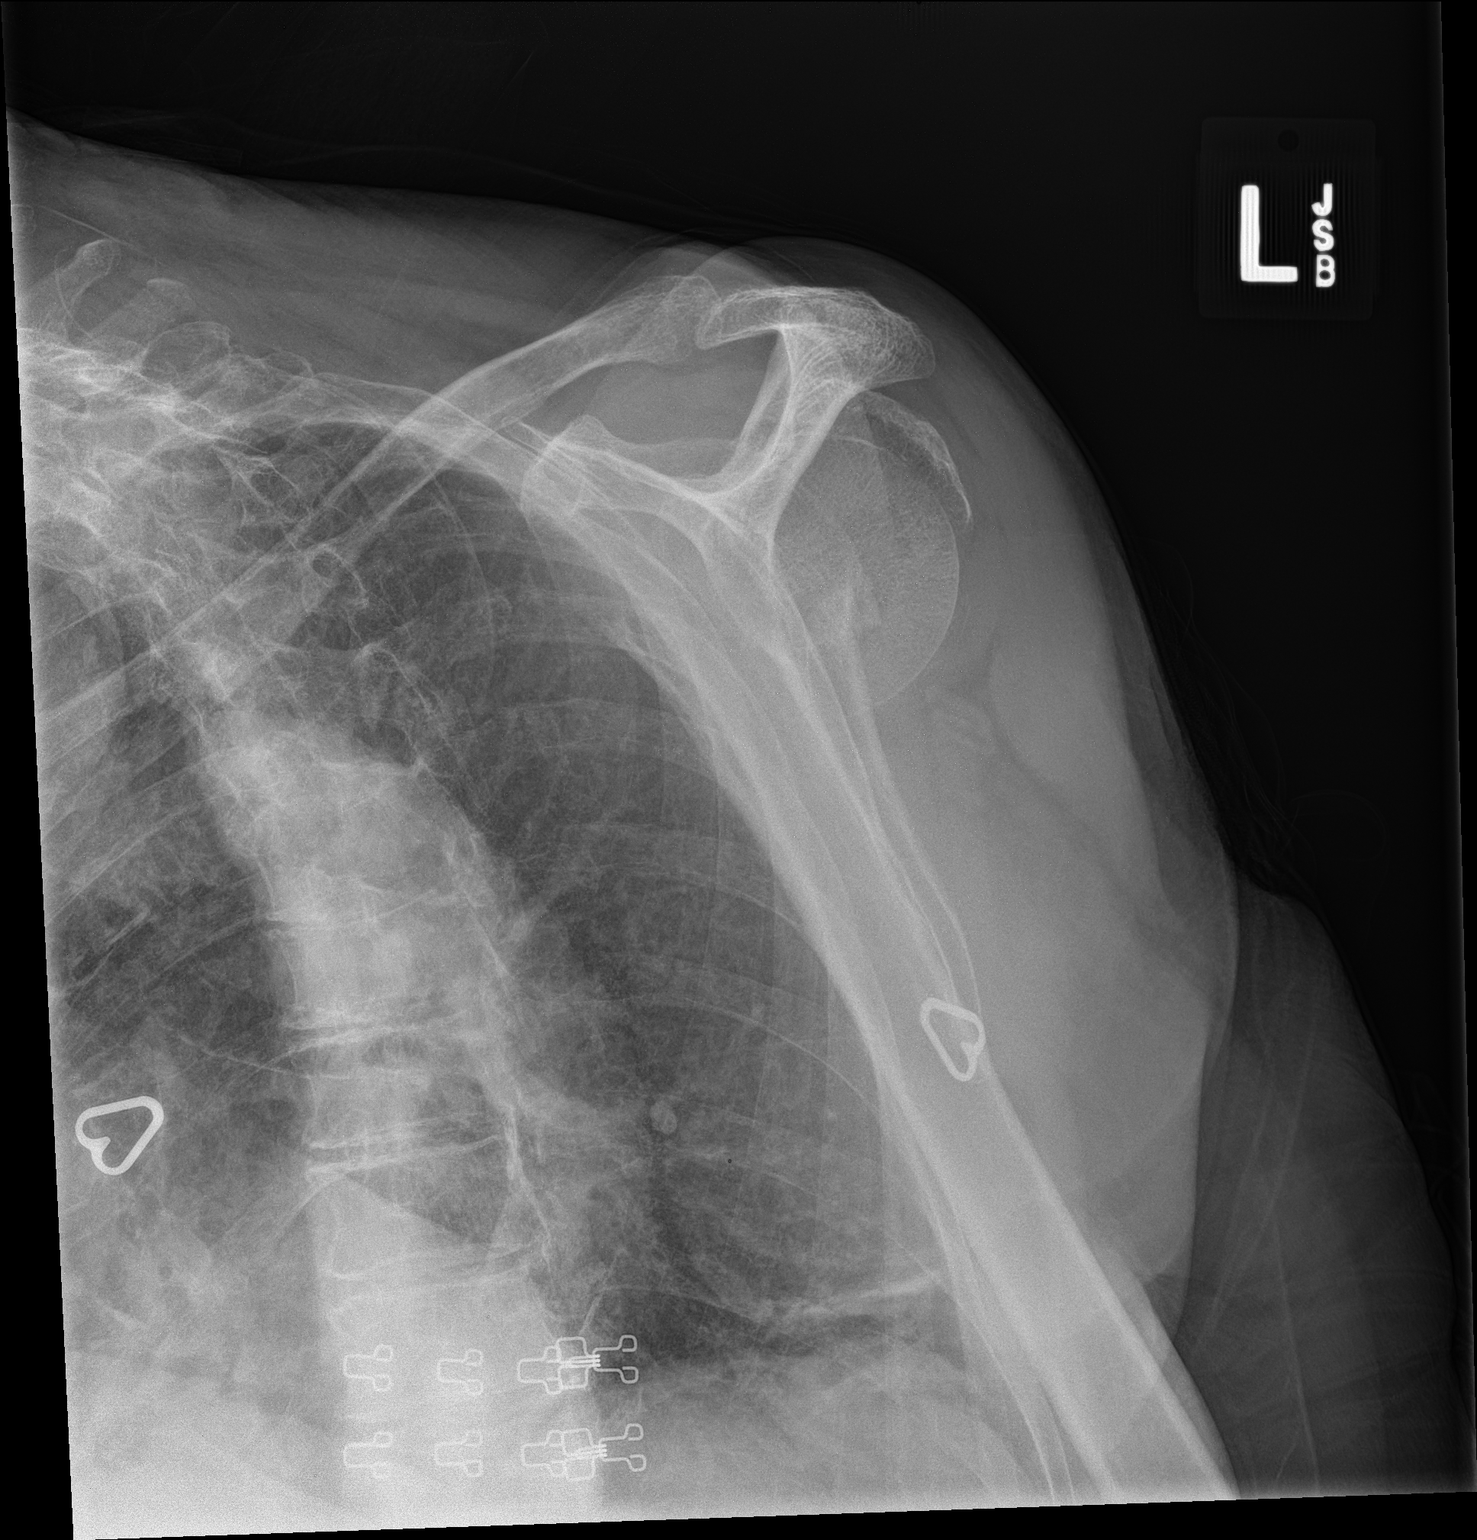

[shoulder ap]
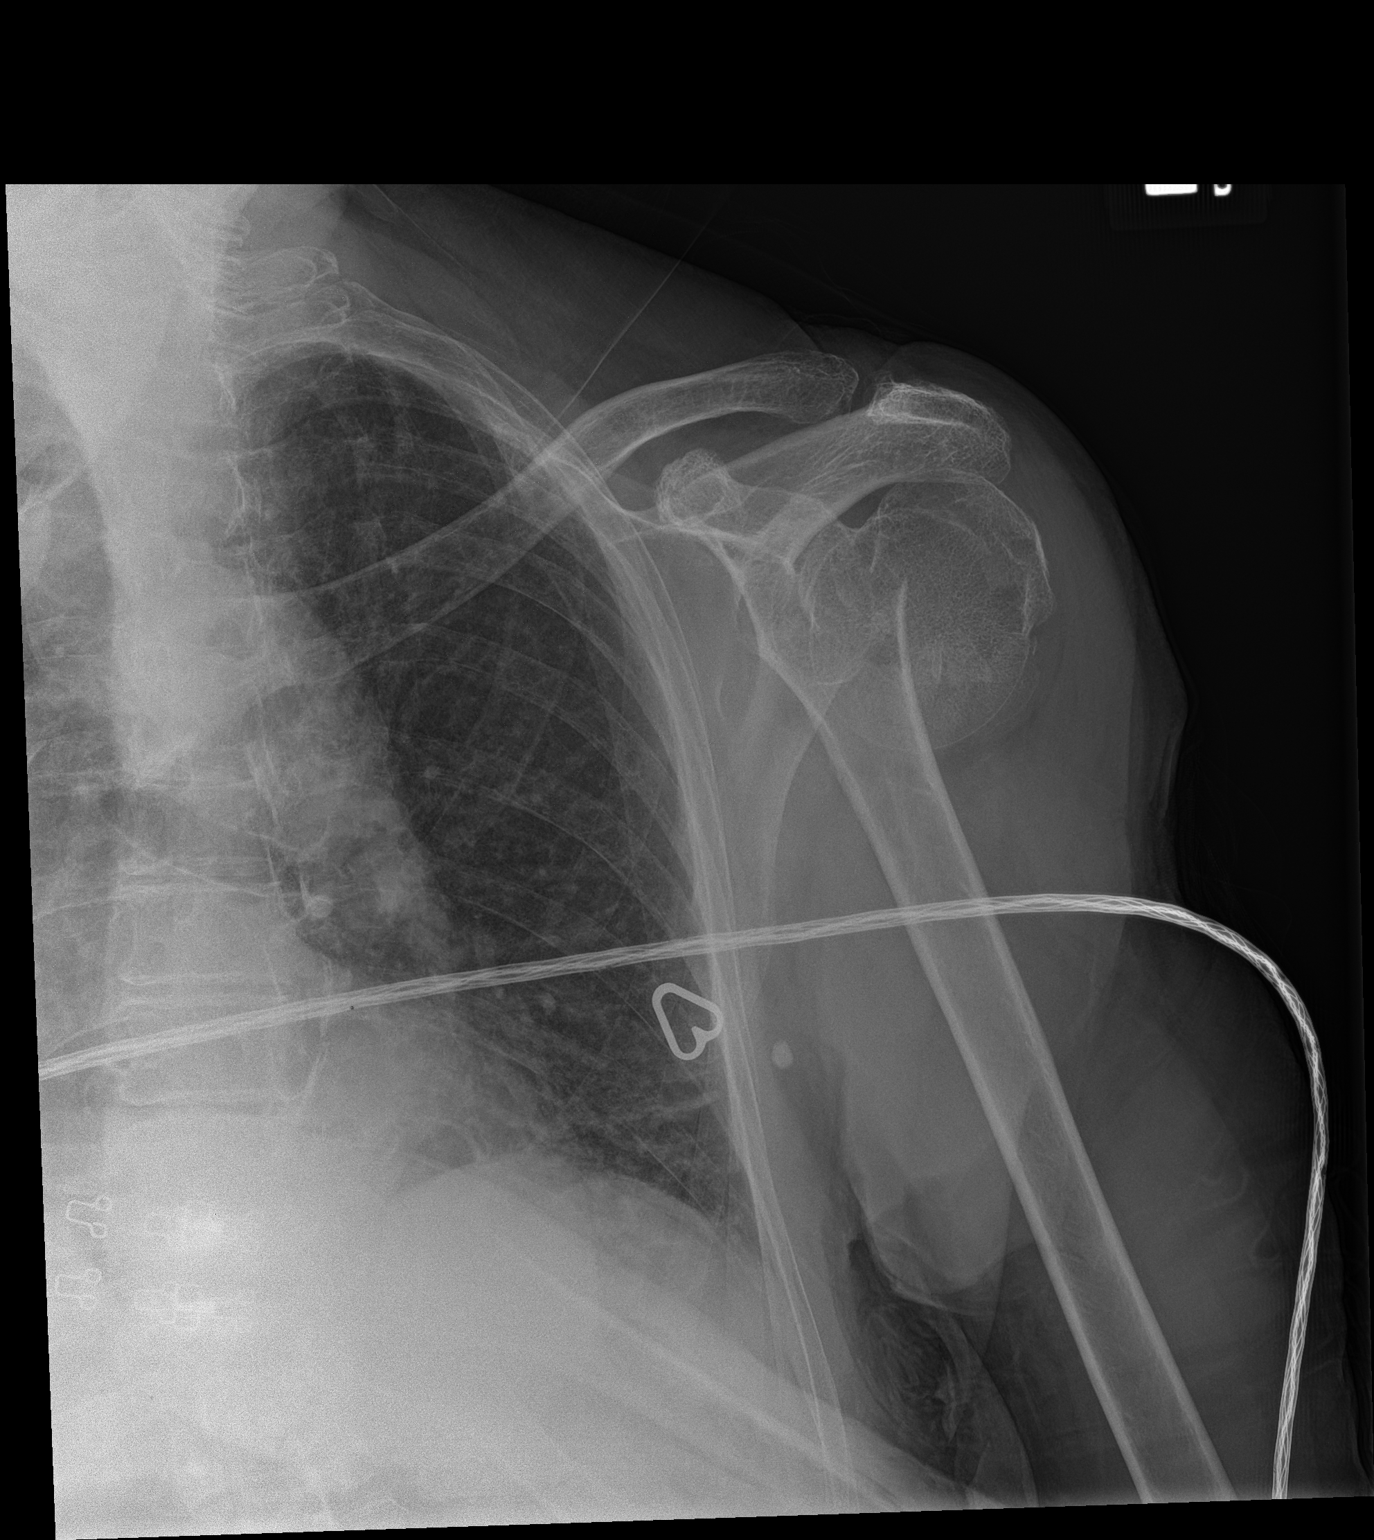

[2 of 2 positions shown; findings below may reference images not displayed]

FINDINGS: Displaced and comminuted fracture through the proximal humerus with
dominant fracture plane through the surgical neck. There is 2 cm of
osseous distraction. Displaced involvement of the humeral head.
Glenohumeral alignment is not well assessed on the provided views.
Acromioclavicular joint is congruent with mild degenerative change.
No evidence of acute rib fracture.
IMPRESSION: Displaced comminuted proximal humerus fracture with dominant
fracture plane through the surgical neck, at least 2 cm osseous
distraction.

## 2020-05-14 DIAGNOSIS — S72002D Fracture of unspecified part of neck of left femur, subsequent encounter for closed fracture with routine healing: Secondary | ICD-10-CM | POA: Diagnosis not present

## 2020-05-14 DIAGNOSIS — Z9181 History of falling: Secondary | ICD-10-CM | POA: Diagnosis not present

## 2020-05-14 DIAGNOSIS — D649 Anemia, unspecified: Secondary | ICD-10-CM | POA: Diagnosis not present

## 2020-05-14 DIAGNOSIS — I480 Paroxysmal atrial fibrillation: Secondary | ICD-10-CM | POA: Diagnosis not present

## 2020-05-14 DIAGNOSIS — L89620 Pressure ulcer of left heel, unstageable: Secondary | ICD-10-CM | POA: Diagnosis not present

## 2020-05-14 DIAGNOSIS — K219 Gastro-esophageal reflux disease without esophagitis: Secondary | ICD-10-CM | POA: Diagnosis not present

## 2020-05-14 DIAGNOSIS — Z8781 Personal history of (healed) traumatic fracture: Secondary | ICD-10-CM | POA: Diagnosis not present

## 2020-05-14 DIAGNOSIS — Z96642 Presence of left artificial hip joint: Secondary | ICD-10-CM | POA: Diagnosis not present

## 2020-05-14 DIAGNOSIS — W010XXD Fall on same level from slipping, tripping and stumbling without subsequent striking against object, subsequent encounter: Secondary | ICD-10-CM | POA: Diagnosis not present

## 2020-05-14 DIAGNOSIS — M1990 Unspecified osteoarthritis, unspecified site: Secondary | ICD-10-CM | POA: Diagnosis not present

## 2020-05-15 DIAGNOSIS — Z8781 Personal history of (healed) traumatic fracture: Secondary | ICD-10-CM | POA: Diagnosis not present

## 2020-05-15 DIAGNOSIS — M1990 Unspecified osteoarthritis, unspecified site: Secondary | ICD-10-CM | POA: Diagnosis not present

## 2020-05-15 DIAGNOSIS — Z96642 Presence of left artificial hip joint: Secondary | ICD-10-CM | POA: Diagnosis not present

## 2020-05-15 DIAGNOSIS — D649 Anemia, unspecified: Secondary | ICD-10-CM | POA: Diagnosis not present

## 2020-05-15 DIAGNOSIS — L89620 Pressure ulcer of left heel, unstageable: Secondary | ICD-10-CM | POA: Diagnosis not present

## 2020-05-15 DIAGNOSIS — I480 Paroxysmal atrial fibrillation: Secondary | ICD-10-CM | POA: Diagnosis not present

## 2020-05-15 DIAGNOSIS — W010XXD Fall on same level from slipping, tripping and stumbling without subsequent striking against object, subsequent encounter: Secondary | ICD-10-CM | POA: Diagnosis not present

## 2020-05-15 DIAGNOSIS — Z9181 History of falling: Secondary | ICD-10-CM | POA: Diagnosis not present

## 2020-05-15 DIAGNOSIS — K219 Gastro-esophageal reflux disease without esophagitis: Secondary | ICD-10-CM | POA: Diagnosis not present

## 2020-05-15 DIAGNOSIS — S72002D Fracture of unspecified part of neck of left femur, subsequent encounter for closed fracture with routine healing: Secondary | ICD-10-CM | POA: Diagnosis not present

## 2020-05-18 DIAGNOSIS — W010XXD Fall on same level from slipping, tripping and stumbling without subsequent striking against object, subsequent encounter: Secondary | ICD-10-CM | POA: Diagnosis not present

## 2020-05-18 DIAGNOSIS — I480 Paroxysmal atrial fibrillation: Secondary | ICD-10-CM | POA: Diagnosis not present

## 2020-05-18 DIAGNOSIS — Z9181 History of falling: Secondary | ICD-10-CM | POA: Diagnosis not present

## 2020-05-18 DIAGNOSIS — Z96642 Presence of left artificial hip joint: Secondary | ICD-10-CM | POA: Diagnosis not present

## 2020-05-18 DIAGNOSIS — L89620 Pressure ulcer of left heel, unstageable: Secondary | ICD-10-CM | POA: Diagnosis not present

## 2020-05-18 DIAGNOSIS — M1990 Unspecified osteoarthritis, unspecified site: Secondary | ICD-10-CM | POA: Diagnosis not present

## 2020-05-18 DIAGNOSIS — S72002D Fracture of unspecified part of neck of left femur, subsequent encounter for closed fracture with routine healing: Secondary | ICD-10-CM | POA: Diagnosis not present

## 2020-05-18 DIAGNOSIS — K219 Gastro-esophageal reflux disease without esophagitis: Secondary | ICD-10-CM | POA: Diagnosis not present

## 2020-05-18 DIAGNOSIS — Z8781 Personal history of (healed) traumatic fracture: Secondary | ICD-10-CM | POA: Diagnosis not present

## 2020-05-18 DIAGNOSIS — D649 Anemia, unspecified: Secondary | ICD-10-CM | POA: Diagnosis not present

## 2020-05-19 DIAGNOSIS — S72002D Fracture of unspecified part of neck of left femur, subsequent encounter for closed fracture with routine healing: Secondary | ICD-10-CM | POA: Diagnosis not present

## 2020-05-19 DIAGNOSIS — Z96642 Presence of left artificial hip joint: Secondary | ICD-10-CM | POA: Diagnosis not present

## 2020-05-19 DIAGNOSIS — W010XXD Fall on same level from slipping, tripping and stumbling without subsequent striking against object, subsequent encounter: Secondary | ICD-10-CM | POA: Diagnosis not present

## 2020-05-19 DIAGNOSIS — Z8781 Personal history of (healed) traumatic fracture: Secondary | ICD-10-CM | POA: Diagnosis not present

## 2020-05-19 DIAGNOSIS — M1990 Unspecified osteoarthritis, unspecified site: Secondary | ICD-10-CM | POA: Diagnosis not present

## 2020-05-19 DIAGNOSIS — D649 Anemia, unspecified: Secondary | ICD-10-CM | POA: Diagnosis not present

## 2020-05-19 DIAGNOSIS — L89620 Pressure ulcer of left heel, unstageable: Secondary | ICD-10-CM | POA: Diagnosis not present

## 2020-05-19 DIAGNOSIS — Z9181 History of falling: Secondary | ICD-10-CM | POA: Diagnosis not present

## 2020-05-19 DIAGNOSIS — K219 Gastro-esophageal reflux disease without esophagitis: Secondary | ICD-10-CM | POA: Diagnosis not present

## 2020-05-19 DIAGNOSIS — I480 Paroxysmal atrial fibrillation: Secondary | ICD-10-CM | POA: Diagnosis not present

## 2020-05-20 DIAGNOSIS — S72002D Fracture of unspecified part of neck of left femur, subsequent encounter for closed fracture with routine healing: Secondary | ICD-10-CM | POA: Diagnosis not present

## 2020-05-20 DIAGNOSIS — M1990 Unspecified osteoarthritis, unspecified site: Secondary | ICD-10-CM | POA: Diagnosis not present

## 2020-05-20 DIAGNOSIS — Z96642 Presence of left artificial hip joint: Secondary | ICD-10-CM | POA: Diagnosis not present

## 2020-05-20 DIAGNOSIS — W010XXD Fall on same level from slipping, tripping and stumbling without subsequent striking against object, subsequent encounter: Secondary | ICD-10-CM | POA: Diagnosis not present

## 2020-05-20 DIAGNOSIS — L89620 Pressure ulcer of left heel, unstageable: Secondary | ICD-10-CM | POA: Diagnosis not present

## 2020-05-20 DIAGNOSIS — I480 Paroxysmal atrial fibrillation: Secondary | ICD-10-CM | POA: Diagnosis not present

## 2020-05-20 DIAGNOSIS — Z9181 History of falling: Secondary | ICD-10-CM | POA: Diagnosis not present

## 2020-05-20 DIAGNOSIS — D649 Anemia, unspecified: Secondary | ICD-10-CM | POA: Diagnosis not present

## 2020-05-20 DIAGNOSIS — Z8781 Personal history of (healed) traumatic fracture: Secondary | ICD-10-CM | POA: Diagnosis not present

## 2020-05-20 DIAGNOSIS — K219 Gastro-esophageal reflux disease without esophagitis: Secondary | ICD-10-CM | POA: Diagnosis not present

## 2020-05-21 ENCOUNTER — Other Ambulatory Visit: Payer: Self-pay

## 2020-05-21 ENCOUNTER — Encounter (INDEPENDENT_AMBULATORY_CARE_PROVIDER_SITE_OTHER): Payer: Medicare Other | Admitting: Ophthalmology

## 2020-05-21 DIAGNOSIS — H2701 Aphakia, right eye: Secondary | ICD-10-CM

## 2020-05-27 DIAGNOSIS — W010XXD Fall on same level from slipping, tripping and stumbling without subsequent striking against object, subsequent encounter: Secondary | ICD-10-CM | POA: Diagnosis not present

## 2020-05-27 DIAGNOSIS — D649 Anemia, unspecified: Secondary | ICD-10-CM | POA: Diagnosis not present

## 2020-05-27 DIAGNOSIS — S72002D Fracture of unspecified part of neck of left femur, subsequent encounter for closed fracture with routine healing: Secondary | ICD-10-CM | POA: Diagnosis not present

## 2020-05-27 DIAGNOSIS — K219 Gastro-esophageal reflux disease without esophagitis: Secondary | ICD-10-CM | POA: Diagnosis not present

## 2020-05-27 DIAGNOSIS — Z8781 Personal history of (healed) traumatic fracture: Secondary | ICD-10-CM | POA: Diagnosis not present

## 2020-05-27 DIAGNOSIS — M1990 Unspecified osteoarthritis, unspecified site: Secondary | ICD-10-CM | POA: Diagnosis not present

## 2020-05-27 DIAGNOSIS — I480 Paroxysmal atrial fibrillation: Secondary | ICD-10-CM | POA: Diagnosis not present

## 2020-05-27 DIAGNOSIS — Z96642 Presence of left artificial hip joint: Secondary | ICD-10-CM | POA: Diagnosis not present

## 2020-05-27 DIAGNOSIS — Z9181 History of falling: Secondary | ICD-10-CM | POA: Diagnosis not present

## 2020-05-27 DIAGNOSIS — L89620 Pressure ulcer of left heel, unstageable: Secondary | ICD-10-CM | POA: Diagnosis not present

## 2020-05-29 DIAGNOSIS — Z9181 History of falling: Secondary | ICD-10-CM | POA: Diagnosis not present

## 2020-05-29 DIAGNOSIS — L89620 Pressure ulcer of left heel, unstageable: Secondary | ICD-10-CM | POA: Diagnosis not present

## 2020-05-29 DIAGNOSIS — Z96642 Presence of left artificial hip joint: Secondary | ICD-10-CM | POA: Diagnosis not present

## 2020-05-29 DIAGNOSIS — W010XXD Fall on same level from slipping, tripping and stumbling without subsequent striking against object, subsequent encounter: Secondary | ICD-10-CM | POA: Diagnosis not present

## 2020-05-29 DIAGNOSIS — Z8781 Personal history of (healed) traumatic fracture: Secondary | ICD-10-CM | POA: Diagnosis not present

## 2020-05-29 DIAGNOSIS — K219 Gastro-esophageal reflux disease without esophagitis: Secondary | ICD-10-CM | POA: Diagnosis not present

## 2020-05-29 DIAGNOSIS — S72002D Fracture of unspecified part of neck of left femur, subsequent encounter for closed fracture with routine healing: Secondary | ICD-10-CM | POA: Diagnosis not present

## 2020-05-29 DIAGNOSIS — I480 Paroxysmal atrial fibrillation: Secondary | ICD-10-CM | POA: Diagnosis not present

## 2020-05-29 DIAGNOSIS — M1990 Unspecified osteoarthritis, unspecified site: Secondary | ICD-10-CM | POA: Diagnosis not present

## 2020-05-29 DIAGNOSIS — D649 Anemia, unspecified: Secondary | ICD-10-CM | POA: Diagnosis not present

## 2020-06-02 ENCOUNTER — Telehealth: Payer: Self-pay | Admitting: Orthopedic Surgery

## 2020-06-02 MED ORDER — CLINDAMYCIN HCL 150 MG PO CAPS: 600.0000 mg | ORAL_CAPSULE | Freq: Once | ORAL | 2 refills | Status: AC

## 2020-06-02 NOTE — Telephone Encounter (Signed)
Sent in per protocol  

## 2020-06-02 NOTE — Telephone Encounter (Signed)
Call received from patient's dentist, Dr Laretta Alstrom' office, ph# [phone] / fax# [phone], inquiring about pre-med antibiotics. States patient came there for routine cleaning, and patient relayed recent hip surgery. Please review and advise.  States they will cancel and reschedule appointment until response received.

## 2020-06-04 ENCOUNTER — Ambulatory Visit (INDEPENDENT_AMBULATORY_CARE_PROVIDER_SITE_OTHER): Payer: Medicare Other | Admitting: Orthopedic Surgery

## 2020-06-04 ENCOUNTER — Encounter: Payer: Self-pay | Admitting: Orthopedic Surgery

## 2020-06-04 ENCOUNTER — Other Ambulatory Visit: Payer: Self-pay

## 2020-06-04 VITALS — BP 129/67 | HR 92 | Ht 61.0 in | Wt 115.0 lb

## 2020-06-04 DIAGNOSIS — K219 Gastro-esophageal reflux disease without esophagitis: Secondary | ICD-10-CM | POA: Diagnosis not present

## 2020-06-04 DIAGNOSIS — M1990 Unspecified osteoarthritis, unspecified site: Secondary | ICD-10-CM | POA: Diagnosis not present

## 2020-06-04 DIAGNOSIS — Z8781 Personal history of (healed) traumatic fracture: Secondary | ICD-10-CM | POA: Diagnosis not present

## 2020-06-04 DIAGNOSIS — Z96642 Presence of left artificial hip joint: Secondary | ICD-10-CM

## 2020-06-04 DIAGNOSIS — Z9181 History of falling: Secondary | ICD-10-CM | POA: Diagnosis not present

## 2020-06-04 DIAGNOSIS — D649 Anemia, unspecified: Secondary | ICD-10-CM | POA: Diagnosis not present

## 2020-06-04 DIAGNOSIS — I480 Paroxysmal atrial fibrillation: Secondary | ICD-10-CM | POA: Diagnosis not present

## 2020-06-04 DIAGNOSIS — L89620 Pressure ulcer of left heel, unstageable: Secondary | ICD-10-CM | POA: Diagnosis not present

## 2020-06-04 DIAGNOSIS — W010XXD Fall on same level from slipping, tripping and stumbling without subsequent striking against object, subsequent encounter: Secondary | ICD-10-CM | POA: Diagnosis not present

## 2020-06-04 DIAGNOSIS — S72002D Fracture of unspecified part of neck of left femur, subsequent encounter for closed fracture with routine healing: Secondary | ICD-10-CM | POA: Diagnosis not present

## 2020-06-04 NOTE — Progress Notes (Signed)
Chief Complaint  Patient presents with  . Hip Pain    s/p hip replacement no pain 03/12/20.    Encounter Diagnosis  Name Primary?  . S/P hip replacement, left (bipolar for left hip fracture treatment) 03/12/20 Yes    3 months post op   Left hip fx   Joyce Hogan is doing well she is walking with a walker she is returned to normal activities she is even washing dishes and her daughter says she is taking care of them now  She moves her hip and knee and leg very well walks with a good gait and can be discharged with a follow-up as needed

## 2020-06-04 NOTE — Patient Instructions (Signed)
NO ANTIBIOTICS NEEDED FOR DENTAL WORK

## 2020-06-10 DIAGNOSIS — D649 Anemia, unspecified: Secondary | ICD-10-CM | POA: Diagnosis not present

## 2020-06-10 DIAGNOSIS — Z9181 History of falling: Secondary | ICD-10-CM | POA: Diagnosis not present

## 2020-06-10 DIAGNOSIS — K219 Gastro-esophageal reflux disease without esophagitis: Secondary | ICD-10-CM | POA: Diagnosis not present

## 2020-06-10 DIAGNOSIS — M1990 Unspecified osteoarthritis, unspecified site: Secondary | ICD-10-CM | POA: Diagnosis not present

## 2020-06-10 DIAGNOSIS — Z8781 Personal history of (healed) traumatic fracture: Secondary | ICD-10-CM | POA: Diagnosis not present

## 2020-06-10 DIAGNOSIS — S72002D Fracture of unspecified part of neck of left femur, subsequent encounter for closed fracture with routine healing: Secondary | ICD-10-CM | POA: Diagnosis not present

## 2020-06-10 DIAGNOSIS — Z96642 Presence of left artificial hip joint: Secondary | ICD-10-CM | POA: Diagnosis not present

## 2020-06-10 DIAGNOSIS — L89620 Pressure ulcer of left heel, unstageable: Secondary | ICD-10-CM | POA: Diagnosis not present

## 2020-06-10 DIAGNOSIS — I480 Paroxysmal atrial fibrillation: Secondary | ICD-10-CM | POA: Diagnosis not present

## 2020-06-10 DIAGNOSIS — W010XXD Fall on same level from slipping, tripping and stumbling without subsequent striking against object, subsequent encounter: Secondary | ICD-10-CM | POA: Diagnosis not present

## 2020-06-30 DIAGNOSIS — L600 Ingrowing nail: Secondary | ICD-10-CM | POA: Diagnosis not present

## 2020-06-30 DIAGNOSIS — L03032 Cellulitis of left toe: Secondary | ICD-10-CM | POA: Diagnosis not present

## 2020-06-30 DIAGNOSIS — I739 Peripheral vascular disease, unspecified: Secondary | ICD-10-CM | POA: Diagnosis not present

## 2020-06-30 DIAGNOSIS — L03031 Cellulitis of right toe: Secondary | ICD-10-CM | POA: Diagnosis not present

## 2020-06-30 DIAGNOSIS — M79671 Pain in right foot: Secondary | ICD-10-CM | POA: Diagnosis not present

## 2020-08-03 DIAGNOSIS — M8000XD Age-related osteoporosis with current pathological fracture, unspecified site, subsequent encounter for fracture with routine healing: Secondary | ICD-10-CM | POA: Diagnosis not present

## 2020-08-03 DIAGNOSIS — I10 Essential (primary) hypertension: Secondary | ICD-10-CM | POA: Diagnosis not present

## 2020-08-21 IMAGING — CT CT RENAL STONE PROTOCOL
2 of 4 series · 13 of 46 positions shown, 15 images · non-contrast
Comparison: Chest radiograph 12/08/2019, 02/13/2020

CLINICAL DATA: Right flank pain for 2 days

EXAM:
CT ABDOMEN AND PELVIS WITHOUT CONTRAST
TECHNIQUE: Multidetector CT imaging of the abdomen and pelvis was performed
following the standard protocol without IV contrast.

[Series 2: axial st · axial · 0.71mm/px · z∈[-612,-232]mm · 10 of 88 slices shown, 12 images]
[im 6/88  soft-tissue]
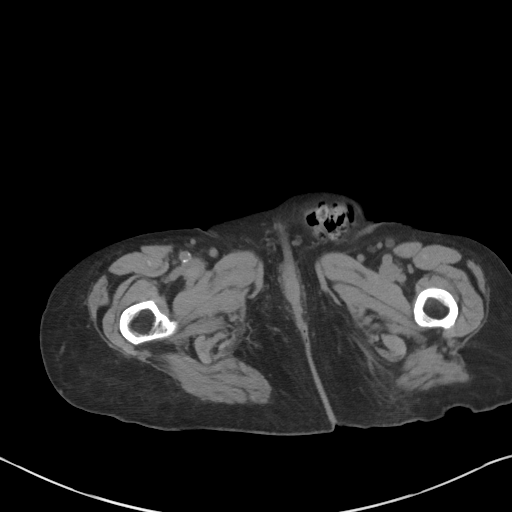
[im 6/88  bone]
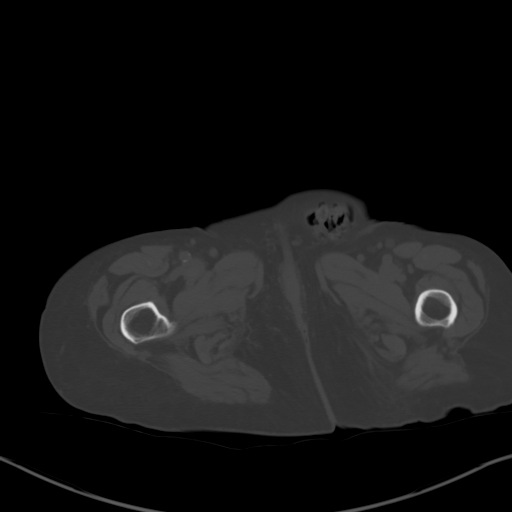
[im 16/88  soft-tissue]
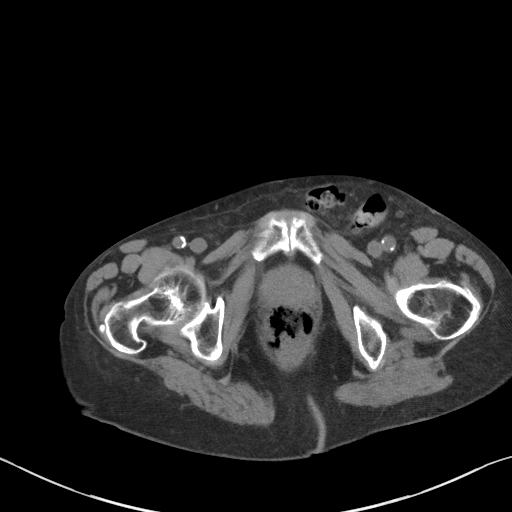
[im 26/88  soft-tissue]
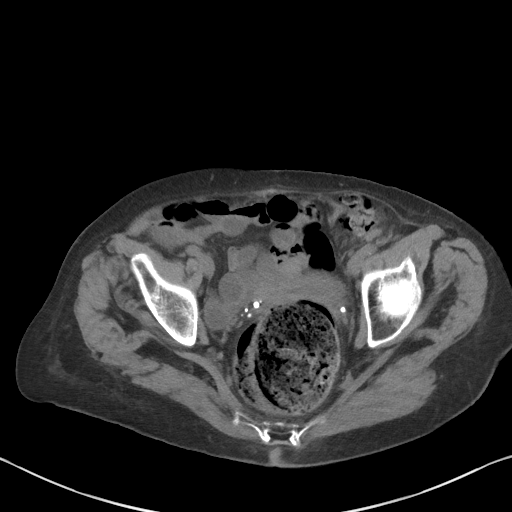
[im 31/88  soft-tissue]
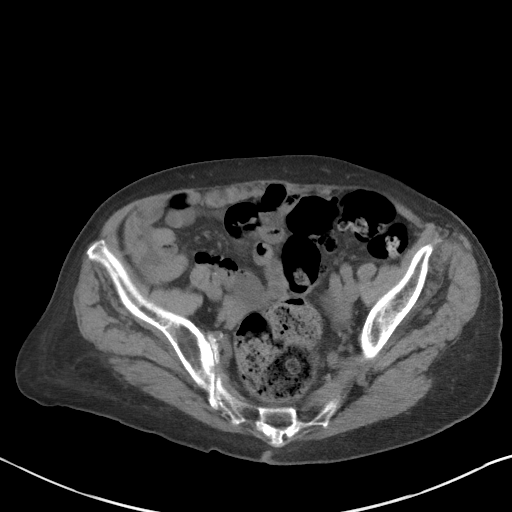
[im 41/88  soft-tissue]
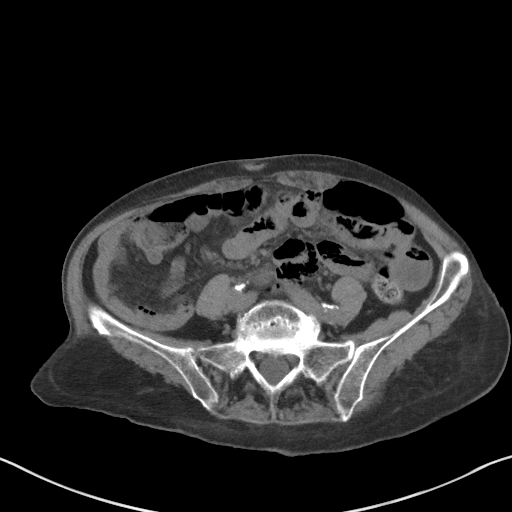
[im 47/88  soft-tissue]
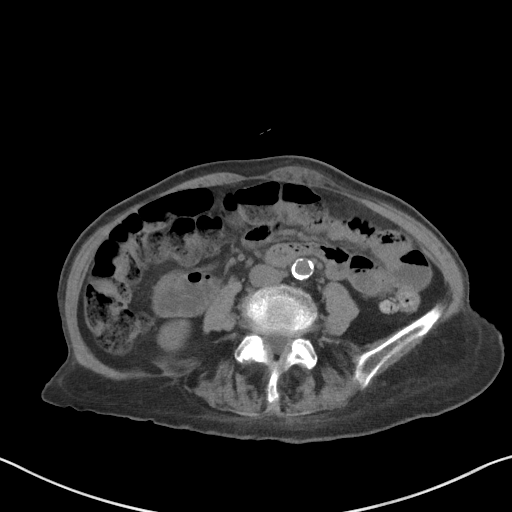
[im 57/88  soft-tissue]
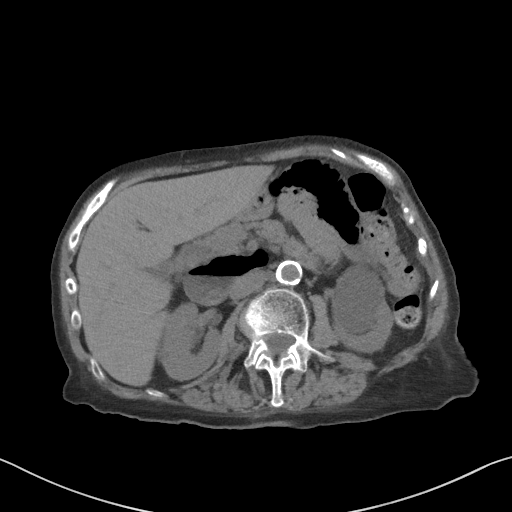
[im 67/88  soft-tissue]
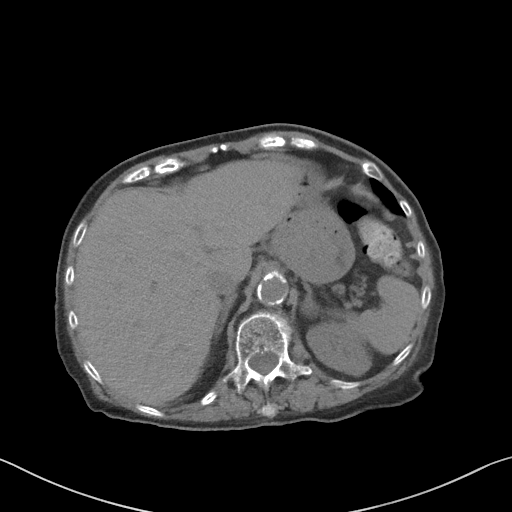
[im 72/88  soft-tissue]
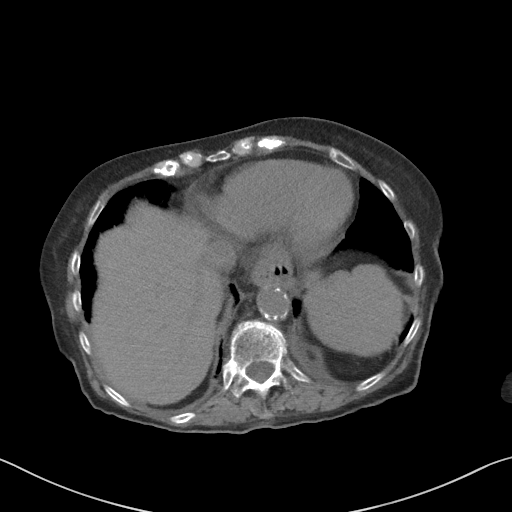
[im 72/88  bone]
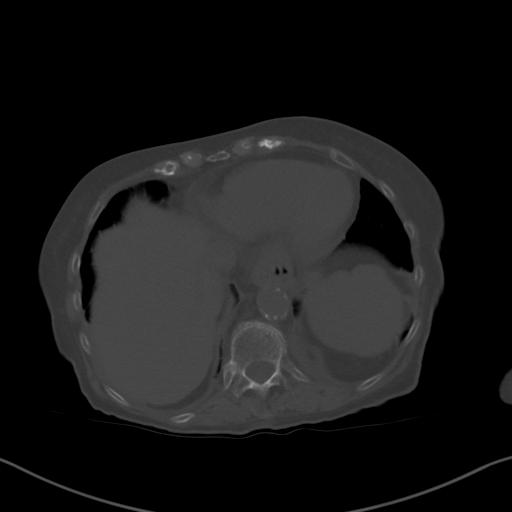
[im 82/88  soft-tissue]
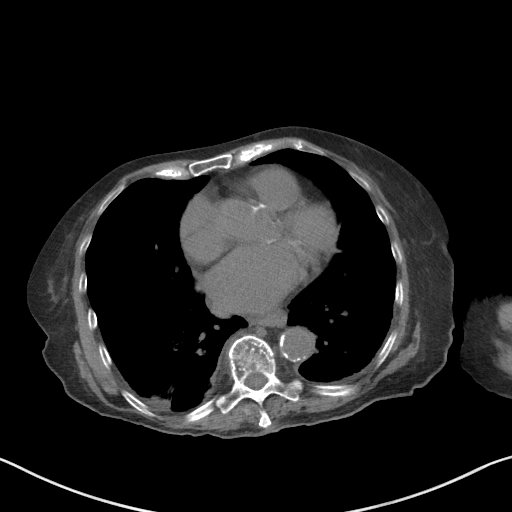

[Series 5: coronal st · coronal · 0.77mm/px · 3 of 93 slices shown]
[im 31/93  soft-tissue]
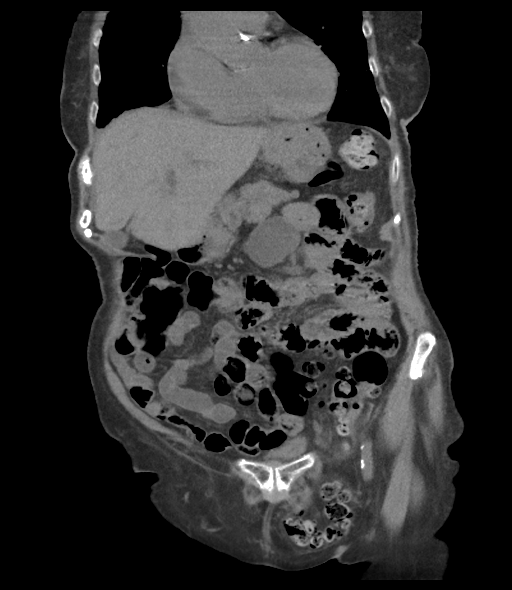
[im 41/93  soft-tissue]
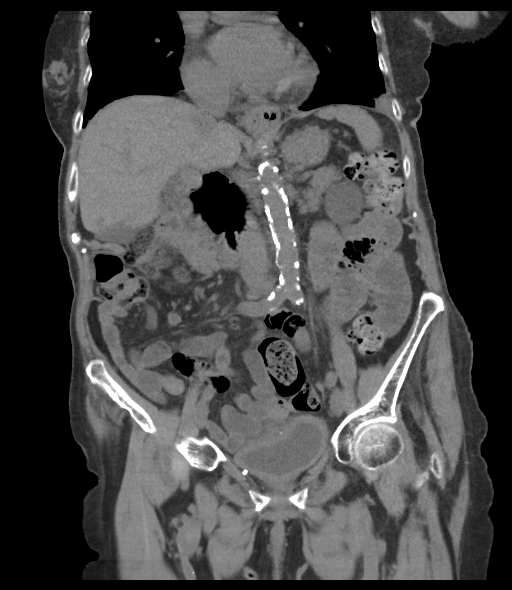
[im 52/93  soft-tissue]
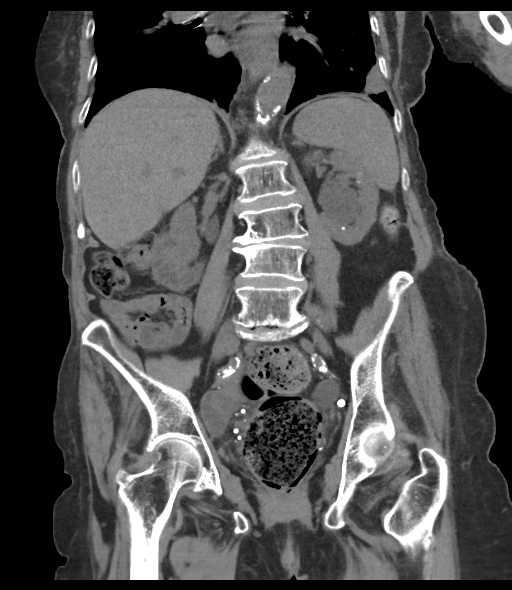

[13 of 46 positions shown; findings below may reference images not displayed]

FINDINGS: Lower chest: Bibasilar airways thickening and diffuse secretions
with more mixed ground-glass and consolidative opacity in the right
lower lobe as well as a more consolidative opacity in the periphery
of the left lung base as well which could reflect further infection
or round atelectasis though an underlying pleural abnormality is not
well demonstrated. Mild cardiomegaly. Trace pericardial effusion.
Dense calcifications of the included thoracic aorta and aortic valve
leaflets as well as the coronary arteries.

Hepatobiliary: Smooth liver surface contour. No visible focal liver
lesion. Gallbladder demonstrates multiple prominent fold but without
pericholecystic inflammation. No visible calcified intraductal
gallstones or biliary dilatation.

Pancreas: Unremarkable. No pancreatic ductal dilatation or
surrounding inflammatory changes.

Spleen: Normal in size without focal abnormality.

Adrenals/Urinary Tract: Normal adrenal glands 4 cm fluid attenuation
cyst seen in the lower pole left kidney. Additional partially
exophytic 1.3 cm. Indeterminate intermediate attenuation cyst
measuring 31 HU seen arising from the posterior interpolar left
kidney ([DATE]). Incompletely characterized on this exam. Several
smaller subcentimeter hyperattenuating foci are present as well
presumably hyperdense cysts ([REDACTED]). Additional subcentimeter
hypoattenuating foci in the kidneys too small to fully characterize
on CT imaging but statistically likely benign. Nonobstructing
calculi present in the lower poles both kidneys. No visible
obstructive urolithiasis along the course of either ureter.
Extensive pelvic phleboliths are noted. Circumferential bladder wall
thickening, greater than expected for underdistention.

Stomach/Bowel: Small hiatal hernia. Distal stomach is unremarkable.
Large air filled duodenal diverticulum (5/40) measuring up to 5 cm
in size. Duodenum otherwise unremarkable normally crossing the
midline abdomen. No small bowel dilatation or wall thickening.
Appendix is not visualized. No focal inflammation the vicinity of
the cecum to suggest an occult appendicitis. Moderate colonic stool
burden. Portion of the sigmoid colon protruding into a left inguinal
hernia without evidence of mechanical bowel obstruction or vascular
compromise. Large inspissated rectal stool ball mural thickening and
presacral fat stranding measuring up to 8.5 cm in maximal diameter
(2/65).

Vascular/Lymphatic: Atherosclerotic calcifications throughout the
abdominal aorta and branch vessels. No aneurysm or ectasia. No
enlarged abdominopelvic lymph nodes.

Reproductive: Uterus is surgically absent. 3.6 cm cyst seen in the
left ovary. The right ovarian tissue is poorly visualized.

Other: Fat and bowel containing left groin hernia. No abdominopelvic
free air or fluid. Presacral fat stranding, as above.

Musculoskeletal: Multilevel degenerative changes are present in the
imaged portions of the spine. Likely remote compression deformity of
T12 with approximately 40% height loss. Likely vertebral body
hemangiomata at the L2 and 3 levels. Levocurvature of the lumbar
spine, apex L3-4. Minimal compensatory dextrocurvature of the lower
thoracic spine. Asymmetric sclerosis of the right SI joint may
reflect sequela of prior sacroiliitis.
IMPRESSION: 1. Large inspissated rectal stool ball with mural thickening and
presacral fat stranding measuring up to 8.5 cm in maximal diameter.
Findings are concerning for stercoral colitis.
2. Fat and sigmoid colon containing left groin hernia without
evidence of vascular compromise or mechanical obstruction.
3. Circumferential bladder wall thickening, greater than expected
for underdistention. Recommend correlation with urinalysis to
exclude cystitis.
4. Bibasilar airways thickening and diffuse secretions with more
mixed ground-glass and consolidative opacity in the right lower lobe
as well as a more consolidative opacity in the periphery of the left
lung base as well which could reflect further infection or round
atelectasis though an underlying pleural abnormality is not well
demonstrated. Recommend follow-up chest CT in 6-8 weeks following
appropriate therapy to ensure resolution.
5. Nonobstructing bilateral nephrolithiasis. No visible obstructive
urolithiasis along the course of either ureter.
6. Indeterminate intermediate attenuation cyst in the posterior left
interpolar kidney. Consider outpatient evaluation with nonemergent
renal ultrasound.
7. 3.6 cm left ovarian cyst. Recommend further evaluation with
pelvic ultrasound on a nonemergent basis. This recommendation
follows ACR consensus guidelines: White Paper of the ACR Incidental
Findings Committee II on Adnexal Findings. [HOSPITAL]
[DATE].
8. Likely remote compression deformity of T12 with approximately 40%
height loss.
9. Vertebral body hemangiomata at L2 and L3.
10. Asymmetric sclerosis of the right SI joint may reflect sequela
of prior sacroiliitis though could correlate for acute symptoms
11. Aortic Atherosclerosis ([id number].P).

## 2020-09-08 DIAGNOSIS — L03032 Cellulitis of left toe: Secondary | ICD-10-CM | POA: Diagnosis not present

## 2020-09-08 DIAGNOSIS — I739 Peripheral vascular disease, unspecified: Secondary | ICD-10-CM | POA: Diagnosis not present

## 2020-09-08 DIAGNOSIS — M79671 Pain in right foot: Secondary | ICD-10-CM | POA: Diagnosis not present

## 2020-09-08 DIAGNOSIS — L03031 Cellulitis of right toe: Secondary | ICD-10-CM | POA: Diagnosis not present

## 2020-09-08 DIAGNOSIS — L600 Ingrowing nail: Secondary | ICD-10-CM | POA: Diagnosis not present

## 2020-11-03 DIAGNOSIS — I10 Essential (primary) hypertension: Secondary | ICD-10-CM | POA: Diagnosis not present

## 2020-11-03 DIAGNOSIS — M8000XD Age-related osteoporosis with current pathological fracture, unspecified site, subsequent encounter for fracture with routine healing: Secondary | ICD-10-CM | POA: Diagnosis not present

## 2020-11-03 DIAGNOSIS — Z0000 Encounter for general adult medical examination without abnormal findings: Secondary | ICD-10-CM | POA: Diagnosis not present

## 2020-11-24 DIAGNOSIS — L03032 Cellulitis of left toe: Secondary | ICD-10-CM | POA: Diagnosis not present

## 2020-11-24 DIAGNOSIS — L600 Ingrowing nail: Secondary | ICD-10-CM | POA: Diagnosis not present

## 2020-11-24 DIAGNOSIS — L03031 Cellulitis of right toe: Secondary | ICD-10-CM | POA: Diagnosis not present

## 2020-11-24 DIAGNOSIS — M79671 Pain in right foot: Secondary | ICD-10-CM | POA: Diagnosis not present

## 2020-11-24 DIAGNOSIS — M79672 Pain in left foot: Secondary | ICD-10-CM | POA: Diagnosis not present

## 2020-11-24 DIAGNOSIS — I739 Peripheral vascular disease, unspecified: Secondary | ICD-10-CM | POA: Diagnosis not present

## 2021-01-15 DIAGNOSIS — R319 Hematuria, unspecified: Secondary | ICD-10-CM | POA: Diagnosis not present

## 2021-01-15 DIAGNOSIS — R112 Nausea with vomiting, unspecified: Secondary | ICD-10-CM | POA: Diagnosis not present

## 2021-01-25 DIAGNOSIS — I739 Peripheral vascular disease, unspecified: Secondary | ICD-10-CM | POA: Diagnosis not present

## 2021-01-25 DIAGNOSIS — L03032 Cellulitis of left toe: Secondary | ICD-10-CM | POA: Diagnosis not present

## 2021-01-25 DIAGNOSIS — M79672 Pain in left foot: Secondary | ICD-10-CM | POA: Diagnosis not present

## 2021-01-25 DIAGNOSIS — L600 Ingrowing nail: Secondary | ICD-10-CM | POA: Diagnosis not present

## 2021-01-25 DIAGNOSIS — M79671 Pain in right foot: Secondary | ICD-10-CM | POA: Diagnosis not present

## 2021-01-25 DIAGNOSIS — L03031 Cellulitis of right toe: Secondary | ICD-10-CM | POA: Diagnosis not present

## 2021-02-02 DIAGNOSIS — L570 Actinic keratosis: Secondary | ICD-10-CM | POA: Diagnosis not present

## 2021-02-02 DIAGNOSIS — Z0000 Encounter for general adult medical examination without abnormal findings: Secondary | ICD-10-CM | POA: Diagnosis not present

## 2021-02-02 DIAGNOSIS — M8000XD Age-related osteoporosis with current pathological fracture, unspecified site, subsequent encounter for fracture with routine healing: Secondary | ICD-10-CM | POA: Diagnosis not present

## 2021-02-02 DIAGNOSIS — I10 Essential (primary) hypertension: Secondary | ICD-10-CM | POA: Diagnosis not present

## 2021-02-12 DIAGNOSIS — N281 Cyst of kidney, acquired: Secondary | ICD-10-CM | POA: Diagnosis not present

## 2021-03-08 DIAGNOSIS — R110 Nausea: Secondary | ICD-10-CM | POA: Diagnosis not present

## 2021-03-08 DIAGNOSIS — R1084 Generalized abdominal pain: Secondary | ICD-10-CM | POA: Diagnosis not present

## 2021-04-19 DIAGNOSIS — M79674 Pain in right toe(s): Secondary | ICD-10-CM | POA: Diagnosis not present

## 2021-04-19 DIAGNOSIS — L110 Acquired keratosis follicularis: Secondary | ICD-10-CM | POA: Diagnosis not present

## 2021-04-19 DIAGNOSIS — M79672 Pain in left foot: Secondary | ICD-10-CM | POA: Diagnosis not present

## 2021-04-19 DIAGNOSIS — M79675 Pain in left toe(s): Secondary | ICD-10-CM | POA: Diagnosis not present

## 2021-04-19 DIAGNOSIS — I739 Peripheral vascular disease, unspecified: Secondary | ICD-10-CM | POA: Diagnosis not present

## 2021-04-19 DIAGNOSIS — M79671 Pain in right foot: Secondary | ICD-10-CM | POA: Diagnosis not present

## 2021-04-19 DIAGNOSIS — L03031 Cellulitis of right toe: Secondary | ICD-10-CM | POA: Diagnosis not present

## 2021-04-19 DIAGNOSIS — L03032 Cellulitis of left toe: Secondary | ICD-10-CM | POA: Diagnosis not present

## 2021-05-19 DIAGNOSIS — M8000XD Age-related osteoporosis with current pathological fracture, unspecified site, subsequent encounter for fracture with routine healing: Secondary | ICD-10-CM | POA: Diagnosis not present

## 2021-05-19 DIAGNOSIS — I10 Essential (primary) hypertension: Secondary | ICD-10-CM | POA: Diagnosis not present

## 2021-05-19 DIAGNOSIS — L570 Actinic keratosis: Secondary | ICD-10-CM | POA: Diagnosis not present

## 2021-05-21 ENCOUNTER — Encounter (INDEPENDENT_AMBULATORY_CARE_PROVIDER_SITE_OTHER): Payer: Medicare Other | Admitting: Ophthalmology

## 2021-06-04 ENCOUNTER — Other Ambulatory Visit: Payer: Self-pay

## 2021-06-04 ENCOUNTER — Encounter (INDEPENDENT_AMBULATORY_CARE_PROVIDER_SITE_OTHER): Payer: Medicare Other | Admitting: Ophthalmology

## 2021-06-04 DIAGNOSIS — H353134 Nonexudative age-related macular degeneration, bilateral, advanced atrophic with subfoveal involvement: Secondary | ICD-10-CM | POA: Diagnosis not present

## 2021-06-04 DIAGNOSIS — H43813 Vitreous degeneration, bilateral: Secondary | ICD-10-CM | POA: Diagnosis not present

## 2021-07-05 DIAGNOSIS — M79671 Pain in right foot: Secondary | ICD-10-CM | POA: Diagnosis not present

## 2021-07-05 DIAGNOSIS — I739 Peripheral vascular disease, unspecified: Secondary | ICD-10-CM | POA: Diagnosis not present

## 2021-07-05 DIAGNOSIS — L03032 Cellulitis of left toe: Secondary | ICD-10-CM | POA: Diagnosis not present

## 2021-07-05 DIAGNOSIS — L600 Ingrowing nail: Secondary | ICD-10-CM | POA: Diagnosis not present

## 2021-07-05 DIAGNOSIS — M79672 Pain in left foot: Secondary | ICD-10-CM | POA: Diagnosis not present

## 2021-07-05 DIAGNOSIS — L03031 Cellulitis of right toe: Secondary | ICD-10-CM | POA: Diagnosis not present

## 2021-08-26 DIAGNOSIS — M8000XD Age-related osteoporosis with current pathological fracture, unspecified site, subsequent encounter for fracture with routine healing: Secondary | ICD-10-CM | POA: Diagnosis not present

## 2021-08-26 DIAGNOSIS — I10 Essential (primary) hypertension: Secondary | ICD-10-CM | POA: Diagnosis not present

## 2021-08-26 DIAGNOSIS — K220 Achalasia of cardia: Secondary | ICD-10-CM | POA: Diagnosis not present

## 2021-09-01 DIAGNOSIS — I739 Peripheral vascular disease, unspecified: Secondary | ICD-10-CM | POA: Diagnosis not present

## 2021-09-01 DIAGNOSIS — L03031 Cellulitis of right toe: Secondary | ICD-10-CM | POA: Diagnosis not present

## 2021-09-01 DIAGNOSIS — M79672 Pain in left foot: Secondary | ICD-10-CM | POA: Diagnosis not present

## 2021-09-01 DIAGNOSIS — M79671 Pain in right foot: Secondary | ICD-10-CM | POA: Diagnosis not present

## 2021-09-01 DIAGNOSIS — L600 Ingrowing nail: Secondary | ICD-10-CM | POA: Diagnosis not present

## 2021-09-01 DIAGNOSIS — L03032 Cellulitis of left toe: Secondary | ICD-10-CM | POA: Diagnosis not present

## 2021-10-13 DIAGNOSIS — M79671 Pain in right foot: Secondary | ICD-10-CM | POA: Diagnosis not present

## 2021-10-13 DIAGNOSIS — L03032 Cellulitis of left toe: Secondary | ICD-10-CM | POA: Diagnosis not present

## 2021-10-13 DIAGNOSIS — L600 Ingrowing nail: Secondary | ICD-10-CM | POA: Diagnosis not present

## 2021-10-13 DIAGNOSIS — I739 Peripheral vascular disease, unspecified: Secondary | ICD-10-CM | POA: Diagnosis not present

## 2021-10-13 DIAGNOSIS — L03031 Cellulitis of right toe: Secondary | ICD-10-CM | POA: Diagnosis not present

## 2021-10-13 DIAGNOSIS — M79672 Pain in left foot: Secondary | ICD-10-CM | POA: Diagnosis not present

## 2021-12-16 DIAGNOSIS — Z0000 Encounter for general adult medical examination without abnormal findings: Secondary | ICD-10-CM | POA: Diagnosis not present

## 2021-12-16 DIAGNOSIS — M8000XD Age-related osteoporosis with current pathological fracture, unspecified site, subsequent encounter for fracture with routine healing: Secondary | ICD-10-CM | POA: Diagnosis not present

## 2021-12-16 DIAGNOSIS — I10 Essential (primary) hypertension: Secondary | ICD-10-CM | POA: Diagnosis not present

## 2021-12-16 DIAGNOSIS — K220 Achalasia of cardia: Secondary | ICD-10-CM | POA: Diagnosis not present

## 2021-12-21 ENCOUNTER — Encounter (INDEPENDENT_AMBULATORY_CARE_PROVIDER_SITE_OTHER): Payer: Self-pay | Admitting: *Deleted

## 2021-12-22 DIAGNOSIS — M79671 Pain in right foot: Secondary | ICD-10-CM | POA: Diagnosis not present

## 2021-12-22 DIAGNOSIS — I739 Peripheral vascular disease, unspecified: Secondary | ICD-10-CM | POA: Diagnosis not present

## 2021-12-22 DIAGNOSIS — L03032 Cellulitis of left toe: Secondary | ICD-10-CM | POA: Diagnosis not present

## 2021-12-22 DIAGNOSIS — M79672 Pain in left foot: Secondary | ICD-10-CM | POA: Diagnosis not present

## 2021-12-22 DIAGNOSIS — L03031 Cellulitis of right toe: Secondary | ICD-10-CM | POA: Diagnosis not present

## 2021-12-22 DIAGNOSIS — L600 Ingrowing nail: Secondary | ICD-10-CM | POA: Diagnosis not present

## 2021-12-23 ENCOUNTER — Ambulatory Visit (INDEPENDENT_AMBULATORY_CARE_PROVIDER_SITE_OTHER): Payer: Medicare Other | Admitting: Gastroenterology

## 2021-12-23 ENCOUNTER — Encounter (INDEPENDENT_AMBULATORY_CARE_PROVIDER_SITE_OTHER): Payer: Self-pay | Admitting: Gastroenterology

## 2021-12-23 ENCOUNTER — Encounter (INDEPENDENT_AMBULATORY_CARE_PROVIDER_SITE_OTHER): Payer: Self-pay

## 2021-12-23 ENCOUNTER — Other Ambulatory Visit (INDEPENDENT_AMBULATORY_CARE_PROVIDER_SITE_OTHER): Payer: Self-pay

## 2021-12-23 VITALS — BP 132/70 | Temp 98.7°F | Ht 61.0 in | Wt 132.0 lb

## 2021-12-23 DIAGNOSIS — D649 Anemia, unspecified: Secondary | ICD-10-CM

## 2021-12-23 DIAGNOSIS — R140 Abdominal distension (gaseous): Secondary | ICD-10-CM | POA: Diagnosis not present

## 2021-12-23 DIAGNOSIS — R112 Nausea with vomiting, unspecified: Secondary | ICD-10-CM

## 2021-12-23 DIAGNOSIS — R1084 Generalized abdominal pain: Secondary | ICD-10-CM

## 2021-12-23 DIAGNOSIS — Z79899 Other long term (current) drug therapy: Secondary | ICD-10-CM | POA: Diagnosis not present

## 2021-12-23 MED ORDER — FAMOTIDINE 20 MG PO TABS: 20.0000 mg | ORAL_TABLET | Freq: Every day | ORAL | 1 refills | Status: DC

## 2021-12-23 NOTE — Patient Instructions (Signed)
Please continue with your current gastroparesis diet since you have having improvement of symptoms with this ?I have sent famotidine '20mg'$  to your pharmacy, you can try taking this once a day to see if cough improves ?We will check some labs today to further evaluate for your history of anemia ?We will schedule an upper endoscopy to help evaluate for your nausea, vomiting, bloating and abdominal pain ? ?Follow up TBD after procedures ?

## 2021-12-23 NOTE — Progress Notes (Signed)
? ?Referring Provider: Neale Burly, MD ?Primary Care Physician:  Neale Burly, MD ?Primary GI Physician: new ? ?Chief Complaint  ?Patient presents with  ? Nausea  ?  Patient arrives with niece Tomasa Rand. New patient visit. Has some nausea, bloating,  and vomiting up food for about one year.   ?HPI:   ?Ketzaly Cardella is a 88 y.o. female with past medical history of A fib, arthritis, chronic anemia, GERD. ? ?Patient presenting today as a new patient for nausea, vomiting and bloating.  ? ?Patient accompanied by her niece, Arbie Cookey who helps provide the history. She tells me that for the past year she has had frequent episodes of "waterbrash" with severe stomach pain, bloating and vomiting after eating solid foods (this occurs maybe twice a week). Prior to GI symptoms she fell and broke her shoulder (feb 2021), did rehab then had another accident where she broke her hip right after healing from initial injury (April 2021). She was on more medications at that time. Initially when symptoms began (feb 2022), patient was at a church function, she ate lunch and then vomited about 30-60 minutes after this. States that when she does vomit, it will be mostly solid foods. Has had evaluation of the gallbladder which was normal, though they were told that her GI tract was slow moving at the time. Her niece states that after doing research she felt that symptoms matched up with gastroparesis. Patient's sister had history of achalasia which is what PCP felt might be going on instead of gastroparesis due to her lack of DM, however, she tells me that patient has had no issues with dysphagia. She started following the [facility] guidelines for gastroparesis which included changes in diet and exercise. Her symptoms improved quite a bit after starting this. Up until January she had only had a chronic dry cough with frequent spitting due to increased saliva building up in her mouth, usually tums improves this, still has some  bloating. Around January 15th she started having nausea and vomiting after eating again with severe abdominal pain, this went on for a few days, she started on a liquid diet and then slowly reintroduced solid foods. This eventually subsided after a few days. Earlier this month she had another episode of nausea with stomach pain and vomiting a few times after eating. Did liquid diet again for a few days with slow reintroduction of solids, she has felt relatively okay for the past week. Feels that they may have become somewhat complacent on following [facility] recommendations when these episodes were occurring. ? ?She denies any issues with dysphagia, odynophagia. She reports that her appetite is not good though weight has remained stable. Usually she feels better if she lies down when these episodes occur. She has had some early satiety. She has no episodes of heartburn or acid regurgitation. Denies abdominal discomfort unless she has eaten. Will have occasional diarrhea maybe once per month. Occasional constipation but she usually eats more fruits and prunes which helps. Has not had to take any stool softeners or laxatives in a while. Denies any weight loss. She denies passing gas. Denies rectal bleeding or melena.   ? ?Has long history of anemia, looks to be chronically normocytic. Denies any previous investigation or endoscopic evaluation to determine the cause of her anemia. Baseline appears to be in the 8-10 range, however, she did require a PRBC transfusion in June 2021 when hgb dropped to 7.2. ? ?NSAID GBT:DVVO ?Social hx: no etoh or  tobacco use ?Fam hx:no CRC or liver disease ? ?Last Colonoscopy:never ?Last Endoscopy: never ? ?Recommendations:  ? ?Past Medical History:  ?Diagnosis Date  ? A-fib ([place])   ? Arthritis   ? Chronic anemia   ? GERD (gastroesophageal reflux disease)   ? PAF (paroxysmal atrial fibrillation) ([place])   ? No anticoagulation due to chronic anemia  ? ?Past Surgical History:  ?Procedure  Laterality Date  ? ABDOMINAL HYSTERECTOMY    ? CATARACT EXTRACTION W/PHACO Right 01/04/2016  ? Procedure: CATARACT EXTRACTION PHACO AND INTRAOCULAR LENS PLACEMENT (IOC);  Surgeon: Tonny Branch, MD;  Location: [facility];  Service: Ophthalmology;  Laterality: Right;  CDE 9.06  ? CATARACT EXTRACTION W/PHACO Left 01/21/2016  ? Procedure: CATARACT EXTRACTION PHACO AND INTRAOCULAR LENS PLACEMENT (IOC);  Surgeon: Tonny Branch, MD;  Location: [facility];  Service: Ophthalmology;  Laterality: Left;  CDE: 12.65  ? HIP ARTHROPLASTY Left 03/12/2020  ? Procedure: ARTHROPLASTY BIPOLAR HIP (HEMIARTHROPLASTY);  Surgeon: Carole Civil, MD;  Location: [facility];  Service: Orthopedics;  Laterality: Left;  ? TONSILLECTOMY    ? ? ?Current Outpatient Medications  ?Medication Sig Dispense Refill  ? Acetaminophen (TYLENOL CHILDRENS PO) Take by mouth. Children's liquid tylenol - one dose about once a week.    ? Calcium-Vitamin D-Vitamin K (VIACTIV CALCIUM PLUS D) 650-12.5-40 MG-MCG-MCG CHEW Chew 1 each by mouth in the morning and at bedtime.    ? diltiazem (CARDIZEM) 30 MG tablet Take 1 tablet (30 mg total) by mouth 2 (two) times daily. ALL PILLS ARE CRUSHED-MIXED IN APPLESAUCE 60 tablet 0  ? enoxaparin (LOVENOX) 30 MG/0.3ML injection Inject 0.3 mLs (30 mg total) into the skin daily. For post left hip surgery DVT prophylaxis 9 mL 0  ? feeding supplement, ENSURE ENLIVE, (ENSURE ENLIVE) LIQD Take 237 mLs by mouth 2 (two) times daily between meals.    ? Multiple Vitamins-Minerals (PRESERVISION AREDS 2) CHEW Chew 1 tablet by mouth in the morning and at bedtime.    ? NON FORMULARY Diet: __x___ Regular, ?______ NAS, ?_______Consistent Carbohydrate, ?_______NPO ?_____Other    ? NON FORMULARY Diet - Liquids: __X_Regular; ___Thickened ___ Consistency: ___ Nectar, ___Honey, ___ Pudding; ____ Fluid Restriction    ? ondansetron (ZOFRAN) 4 MG tablet Take 1 tablet (4 mg total) by mouth every 6 (six) hours as needed for nausea. 20 tablet 0  ? pantoprazole (PROTONIX)  40 MG tablet Take 1 tablet (40 mg total) by mouth daily. 30 tablet 0  ? potassium chloride 20 MEQ/15ML (10%) SOLN Take 15 mLs (20 mEq total) by mouth daily. 473 mL 0  ? ?No current facility-administered medications for this visit.  ? ? ?Allergies as of 12/23/2021 - Review Complete 12/23/2021  ?Allergen Reaction Noted  ? Penicillins  11/21/2019  ? ? ?Family History  ?Problem Relation Age of Onset  ? Dementia Mother   ? ? ?Social History  ? ?Socioeconomic History  ? Marital status: Widowed  ?  Spouse name: Not on file  ? Number of children: Not on file  ? Years of education: Not on file  ? Highest education level: Not on file  ?Occupational History  ? Not on file  ?Tobacco Use  ? Smoking status: Never  ? Smokeless tobacco: Never  ?Substance and Sexual Activity  ? Alcohol use: No  ? Drug use: No  ? Sexual activity: Never  ?  Birth control/protection: Surgical  ?Other Topics Concern  ? Not on file  ?Social History Narrative  ? Not on file  ? ?Social Determinants  of Health  ? ?Financial Resource Strain: Not on file  ?Food Insecurity: Not on file  ?Transportation Needs: Not on file  ?Physical Activity: Not on file  ?Stress: Not on file  ?Social Connections: Not on file  ? ?Review of systems ?General: negative for malaise, night sweats, fever, chills, weight loss ?Neck: Negative for lumps, goiter, pain and significant neck swelling ?Resp: Negative for cough, wheezing, dyspnea at rest ?CV: Negative for chest pain, leg swelling, palpitations, orthopnea ?GI: denies melena, hematochezia, diarrhea, constipation, dysphagia, odyonophagia, early satiety or unintentional weight loss. +nausea +vomiting +abdominal pain +bloating ?MSK: Negative for joint pain or swelling, back pain, and muscle pain. ?Derm: Negative for itching or rash ?Psych: Denies depression, anxiety, memory loss, confusion. No homicidal or suicidal ideation.  ?Heme: Negative for prolonged bleeding, bruising easily, and swollen nodes. ?Endocrine: Negative for cold  or heat intolerance, polyuria, polydipsia and goiter. ?Neuro: negative for tremor, gait imbalance, syncope and seizures. ?The remainder of the review of systems is noncontributory. ? ?Physical Exam: ?There were

## 2021-12-24 ENCOUNTER — Encounter (INDEPENDENT_AMBULATORY_CARE_PROVIDER_SITE_OTHER): Payer: Self-pay

## 2021-12-25 LAB — COMPREHENSIVE METABOLIC PANEL
AG Ratio: 1.4 (calc) (ref 1.0–2.5)
ALT: 12 U/L (ref 6–29)
AST: 13 U/L (ref 10–35)
Albumin: 4 g/dL (ref 3.6–5.1)
Alkaline phosphatase (APISO): 100 U/L (ref 37–153)
BUN: 13 mg/dL (ref 7–25)
CO2: 27 mmol/L (ref 20–32)
Calcium: 10.7 mg/dL — ABNORMAL HIGH (ref 8.6–10.4)
Chloride: 102 mmol/L (ref 98–110)
Creat: 0.94 mg/dL (ref 0.60–0.95)
Globulin: 2.9 g/dL (calc) (ref 1.9–3.7)
Glucose, Bld: 91 mg/dL (ref 65–139)
Potassium: 4.2 mmol/L (ref 3.5–5.3)
Sodium: 142 mmol/L (ref 135–146)
Total Bilirubin: 0.5 mg/dL (ref 0.2–1.2)
Total Protein: 6.9 g/dL (ref 6.1–8.1)

## 2021-12-25 LAB — IRON,TIBC AND FERRITIN PANEL
%SAT: 33 % (calc) (ref 16–45)
Ferritin: 102 ng/mL (ref 16–288)
Iron: 99 ug/dL (ref 45–160)
TIBC: 296 mcg/dL (calc) (ref 250–450)

## 2021-12-25 LAB — B12 AND FOLATE PANEL
Folate: 10.8 ng/mL
Vitamin B-12: 210 pg/mL (ref 200–1100)

## 2021-12-25 LAB — CBC
HCT: 36.1 % (ref 35.0–45.0)
Hemoglobin: 12 g/dL (ref 11.7–15.5)
MCH: 30.9 pg (ref 27.0–33.0)
MCHC: 33.2 g/dL (ref 32.0–36.0)
MCV: 93 fL (ref 80.0–100.0)
MPV: 10.9 fL (ref 7.5–12.5)
Platelets: 284 10*3/uL (ref 140–400)
RBC: 3.88 10*6/uL (ref 3.80–5.10)
RDW: 12.2 % (ref 11.0–15.0)
WBC: 7.4 10*3/uL (ref 3.8–10.8)

## 2021-12-25 LAB — CELIAC DISEASE PANEL
(tTG) Ab, IgA: <1 U/mL
(tTG) Ab, IgG: <1 U/mL
Gliadin IgA: <1 U/mL
Gliadin IgG: <1 U/mL
Immunoglobulin A: 201 mg/dL (ref 70–320)

## 2021-12-29 DIAGNOSIS — M810 Age-related osteoporosis without current pathological fracture: Secondary | ICD-10-CM | POA: Diagnosis not present

## 2021-12-29 DIAGNOSIS — Z79899 Other long term (current) drug therapy: Secondary | ICD-10-CM | POA: Diagnosis not present

## 2022-01-03 ENCOUNTER — Encounter (INDEPENDENT_AMBULATORY_CARE_PROVIDER_SITE_OTHER): Payer: Self-pay

## 2022-01-07 ENCOUNTER — Encounter (HOSPITAL_COMMUNITY): Payer: Medicare Other

## 2022-01-24 DIAGNOSIS — I517 Cardiomegaly: Secondary | ICD-10-CM | POA: Diagnosis not present

## 2022-01-24 DIAGNOSIS — N281 Cyst of kidney, acquired: Secondary | ICD-10-CM | POA: Diagnosis not present

## 2022-01-24 DIAGNOSIS — R109 Unspecified abdominal pain: Secondary | ICD-10-CM | POA: Diagnosis not present

## 2022-01-24 DIAGNOSIS — K5900 Constipation, unspecified: Secondary | ICD-10-CM | POA: Diagnosis not present

## 2022-01-24 DIAGNOSIS — K810 Acute cholecystitis: Secondary | ICD-10-CM | POA: Diagnosis not present

## 2022-01-24 DIAGNOSIS — R112 Nausea with vomiting, unspecified: Secondary | ICD-10-CM | POA: Diagnosis not present

## 2022-01-24 DIAGNOSIS — R1110 Vomiting, unspecified: Secondary | ICD-10-CM | POA: Diagnosis not present

## 2022-01-24 DIAGNOSIS — K449 Diaphragmatic hernia without obstruction or gangrene: Secondary | ICD-10-CM | POA: Diagnosis not present

## 2022-01-24 DIAGNOSIS — I700 Atherosclerosis of aorta: Secondary | ICD-10-CM | POA: Diagnosis not present

## 2022-01-24 DIAGNOSIS — N200 Calculus of kidney: Secondary | ICD-10-CM | POA: Diagnosis not present

## 2022-01-24 DIAGNOSIS — K838 Other specified diseases of biliary tract: Secondary | ICD-10-CM | POA: Diagnosis not present

## 2022-01-24 DIAGNOSIS — R933 Abnormal findings on diagnostic imaging of other parts of digestive tract: Secondary | ICD-10-CM | POA: Diagnosis not present

## 2022-01-24 DIAGNOSIS — K5730 Diverticulosis of large intestine without perforation or abscess without bleeding: Secondary | ICD-10-CM | POA: Diagnosis not present

## 2022-01-24 DIAGNOSIS — R5381 Other malaise: Secondary | ICD-10-CM | POA: Diagnosis not present

## 2022-01-24 DIAGNOSIS — K828 Other specified diseases of gallbladder: Secondary | ICD-10-CM | POA: Diagnosis not present

## 2022-01-24 DIAGNOSIS — R7989 Other specified abnormal findings of blood chemistry: Secondary | ICD-10-CM | POA: Diagnosis not present

## 2022-01-24 DIAGNOSIS — J9811 Atelectasis: Secondary | ICD-10-CM | POA: Diagnosis not present

## 2022-01-24 DIAGNOSIS — K4090 Unilateral inguinal hernia, without obstruction or gangrene, not specified as recurrent: Secondary | ICD-10-CM | POA: Diagnosis not present

## 2022-01-24 DIAGNOSIS — R935 Abnormal findings on diagnostic imaging of other abdominal regions, including retroperitoneum: Secondary | ICD-10-CM | POA: Diagnosis not present

## 2022-01-25 DIAGNOSIS — K831 Obstruction of bile duct: Secondary | ICD-10-CM | POA: Diagnosis not present

## 2022-01-25 DIAGNOSIS — R739 Hyperglycemia, unspecified: Secondary | ICD-10-CM | POA: Diagnosis not present

## 2022-01-25 DIAGNOSIS — I517 Cardiomegaly: Secondary | ICD-10-CM | POA: Diagnosis not present

## 2022-01-25 DIAGNOSIS — R1110 Vomiting, unspecified: Secondary | ICD-10-CM | POA: Diagnosis not present

## 2022-01-25 DIAGNOSIS — R935 Abnormal findings on diagnostic imaging of other abdominal regions, including retroperitoneum: Secondary | ICD-10-CM | POA: Diagnosis not present

## 2022-01-25 DIAGNOSIS — I700 Atherosclerosis of aorta: Secondary | ICD-10-CM | POA: Diagnosis not present

## 2022-01-25 DIAGNOSIS — D649 Anemia, unspecified: Secondary | ICD-10-CM | POA: Diagnosis not present

## 2022-01-25 DIAGNOSIS — K5730 Diverticulosis of large intestine without perforation or abscess without bleeding: Secondary | ICD-10-CM | POA: Diagnosis not present

## 2022-01-25 DIAGNOSIS — K5900 Constipation, unspecified: Secondary | ICD-10-CM | POA: Diagnosis not present

## 2022-01-25 DIAGNOSIS — M2578 Osteophyte, vertebrae: Secondary | ICD-10-CM | POA: Diagnosis not present

## 2022-01-25 DIAGNOSIS — K219 Gastro-esophageal reflux disease without esophagitis: Secondary | ICD-10-CM | POA: Diagnosis not present

## 2022-01-25 DIAGNOSIS — I10 Essential (primary) hypertension: Secondary | ICD-10-CM | POA: Diagnosis not present

## 2022-01-25 DIAGNOSIS — I4891 Unspecified atrial fibrillation: Secondary | ICD-10-CM | POA: Diagnosis not present

## 2022-01-25 DIAGNOSIS — Z539 Procedure and treatment not carried out, unspecified reason: Secondary | ICD-10-CM | POA: Diagnosis not present

## 2022-01-25 DIAGNOSIS — K828 Other specified diseases of gallbladder: Secondary | ICD-10-CM | POA: Diagnosis not present

## 2022-01-25 DIAGNOSIS — K222 Esophageal obstruction: Secondary | ICD-10-CM | POA: Diagnosis not present

## 2022-01-25 DIAGNOSIS — Z9689 Presence of other specified functional implants: Secondary | ICD-10-CM | POA: Diagnosis not present

## 2022-01-25 DIAGNOSIS — K4090 Unilateral inguinal hernia, without obstruction or gangrene, not specified as recurrent: Secondary | ICD-10-CM | POA: Diagnosis not present

## 2022-01-25 DIAGNOSIS — Z5309 Procedure and treatment not carried out because of other contraindication: Secondary | ICD-10-CM | POA: Diagnosis not present

## 2022-01-25 DIAGNOSIS — K838 Other specified diseases of biliary tract: Secondary | ICD-10-CM | POA: Diagnosis not present

## 2022-01-25 DIAGNOSIS — R109 Unspecified abdominal pain: Secondary | ICD-10-CM | POA: Diagnosis not present

## 2022-01-25 DIAGNOSIS — R7989 Other specified abnormal findings of blood chemistry: Secondary | ICD-10-CM | POA: Diagnosis not present

## 2022-01-25 DIAGNOSIS — K449 Diaphragmatic hernia without obstruction or gangrene: Secondary | ICD-10-CM | POA: Diagnosis not present

## 2022-01-25 DIAGNOSIS — K810 Acute cholecystitis: Secondary | ICD-10-CM | POA: Diagnosis not present

## 2022-01-25 DIAGNOSIS — Z8719 Personal history of other diseases of the digestive system: Secondary | ICD-10-CM | POA: Diagnosis not present

## 2022-01-25 DIAGNOSIS — R0989 Other specified symptoms and signs involving the circulatory and respiratory systems: Secondary | ICD-10-CM | POA: Diagnosis not present

## 2022-01-25 DIAGNOSIS — I480 Paroxysmal atrial fibrillation: Secondary | ICD-10-CM | POA: Diagnosis not present

## 2022-01-25 DIAGNOSIS — K819 Cholecystitis, unspecified: Secondary | ICD-10-CM | POA: Diagnosis not present

## 2022-01-28 ENCOUNTER — Encounter (HOSPITAL_COMMUNITY): Admission: RE | Admit: 2022-01-28 | Payer: Medicare Other | Source: Ambulatory Visit

## 2022-02-01 ENCOUNTER — Ambulatory Visit (HOSPITAL_COMMUNITY): Admit: 2022-02-01 | Payer: Medicare Other | Admitting: Gastroenterology

## 2022-02-01 ENCOUNTER — Encounter (HOSPITAL_COMMUNITY): Payer: Self-pay

## 2022-02-01 SURGERY — ESOPHAGOGASTRODUODENOSCOPY (EGD) WITH PROPOFOL
Anesthesia: Monitor Anesthesia Care

## 2022-02-02 DIAGNOSIS — I10 Essential (primary) hypertension: Secondary | ICD-10-CM | POA: Diagnosis not present

## 2022-02-02 DIAGNOSIS — Z434 Encounter for attention to other artificial openings of digestive tract: Secondary | ICD-10-CM | POA: Diagnosis not present

## 2022-02-02 DIAGNOSIS — K819 Cholecystitis, unspecified: Secondary | ICD-10-CM | POA: Diagnosis not present

## 2022-02-02 DIAGNOSIS — M4802 Spinal stenosis, cervical region: Secondary | ICD-10-CM | POA: Diagnosis not present

## 2022-02-02 DIAGNOSIS — K222 Esophageal obstruction: Secondary | ICD-10-CM | POA: Diagnosis not present

## 2022-02-02 DIAGNOSIS — Z9181 History of falling: Secondary | ICD-10-CM | POA: Diagnosis not present

## 2022-02-02 DIAGNOSIS — Z48815 Encounter for surgical aftercare following surgery on the digestive system: Secondary | ICD-10-CM | POA: Diagnosis not present

## 2022-02-03 ENCOUNTER — Telehealth (INDEPENDENT_AMBULATORY_CARE_PROVIDER_SITE_OTHER): Payer: Self-pay

## 2022-02-03 NOTE — Telephone Encounter (Signed)
Per Niece hospital refused to do anything due to patient age, and patient was not oriented while she was inpatient.  ?

## 2022-02-03 NOTE — Telephone Encounter (Signed)
Would like [place] to follow up on this . ?

## 2022-02-03 NOTE — Telephone Encounter (Signed)
Joyce Hogan patient niece called wanted to make sure you were able to see the notes from [facility]. They were unable to remove the gallbladder, unable to do the imaging, unable to do the ERCP due to a narrow Esophagus, so they have a drain coming from the gallbladder into her belly. She has an upcoming appt 03/01/2022 at 2:30 pm. ?

## 2022-02-03 NOTE — Telephone Encounter (Signed)
If we dont have the copy Dr. Freida Busman has the copies.  ? ?Per Hoyle Sauer patient got sick with a gallbladder attack on 01/24/2022 was seen at [place] on  01/24/2022 on 01/25/2022 they transferred [facility] and she was kept at [facility] through 01/28/2022. ?

## 2022-02-05 DIAGNOSIS — K819 Cholecystitis, unspecified: Secondary | ICD-10-CM | POA: Diagnosis not present

## 2022-02-05 DIAGNOSIS — M4802 Spinal stenosis, cervical region: Secondary | ICD-10-CM | POA: Diagnosis not present

## 2022-02-05 DIAGNOSIS — Z434 Encounter for attention to other artificial openings of digestive tract: Secondary | ICD-10-CM | POA: Diagnosis not present

## 2022-02-05 DIAGNOSIS — I10 Essential (primary) hypertension: Secondary | ICD-10-CM | POA: Diagnosis not present

## 2022-02-05 DIAGNOSIS — Z9181 History of falling: Secondary | ICD-10-CM | POA: Diagnosis not present

## 2022-02-05 DIAGNOSIS — K222 Esophageal obstruction: Secondary | ICD-10-CM | POA: Diagnosis not present

## 2022-02-05 DIAGNOSIS — Z48815 Encounter for surgical aftercare following surgery on the digestive system: Secondary | ICD-10-CM | POA: Diagnosis not present

## 2022-02-06 DIAGNOSIS — K222 Esophageal obstruction: Secondary | ICD-10-CM | POA: Diagnosis not present

## 2022-02-06 DIAGNOSIS — K819 Cholecystitis, unspecified: Secondary | ICD-10-CM | POA: Diagnosis not present

## 2022-02-06 DIAGNOSIS — Z48815 Encounter for surgical aftercare following surgery on the digestive system: Secondary | ICD-10-CM | POA: Diagnosis not present

## 2022-02-06 DIAGNOSIS — I10 Essential (primary) hypertension: Secondary | ICD-10-CM | POA: Diagnosis not present

## 2022-02-06 DIAGNOSIS — Z9181 History of falling: Secondary | ICD-10-CM | POA: Diagnosis not present

## 2022-02-06 DIAGNOSIS — Z434 Encounter for attention to other artificial openings of digestive tract: Secondary | ICD-10-CM | POA: Diagnosis not present

## 2022-02-06 DIAGNOSIS — M4802 Spinal stenosis, cervical region: Secondary | ICD-10-CM | POA: Diagnosis not present

## 2022-02-07 ENCOUNTER — Encounter (INDEPENDENT_AMBULATORY_CARE_PROVIDER_SITE_OTHER): Payer: Self-pay | Admitting: Gastroenterology

## 2022-02-07 ENCOUNTER — Ambulatory Visit (INDEPENDENT_AMBULATORY_CARE_PROVIDER_SITE_OTHER): Payer: Medicare Other | Admitting: Gastroenterology

## 2022-02-07 VITALS — BP 167/72 | HR 92 | Temp 98.7°F | Ht 61.0 in | Wt 128.7 lb

## 2022-02-07 DIAGNOSIS — R112 Nausea with vomiting, unspecified: Secondary | ICD-10-CM | POA: Diagnosis not present

## 2022-02-07 DIAGNOSIS — K810 Acute cholecystitis: Secondary | ICD-10-CM | POA: Diagnosis not present

## 2022-02-07 DIAGNOSIS — R7989 Other specified abnormal findings of blood chemistry: Secondary | ICD-10-CM

## 2022-02-07 NOTE — Progress Notes (Signed)
Referring Provider: Neale Burly, MD Primary Care Physician:  Neale Burly, MD Primary GI Physician: Jenetta Downer  Chief Complaint  Patient presents with   Hospitalization Follow-up    Patient arrives with caregiver [facility] for a hospital follow up. States she is feeling better. Constipation is better. Eats a prune daily and has one stool a day. Vomiting is better.    HPI:   Joyce Hogan is a 88 y.o. female with past medical history of  A fib, arthritis, chronic anemia, GERD.  Patient presenting today for hospital follow up, last seen 12/23/21 for nausea, vomiting and bloating. At that time patient had been having bloating, vomiting and abdominal pain after eating, previous GB evaluation with [place] and HIDA scan was normal. Started on gastroparesis diet and was having Improvement in symptoms. Reported occasional constipation. Hx of anemia without previous endoscopic workup though did require transfusion in June 2021. Scheduled for EGD, celiac panel B12, folate and iron studies checked, started on Pepcid 69m QHS. EGD was supposed to be done on 02/01/22 but had to be cancelled.   Notably, patient had acute onset of worsening vomiting and constipation and [facility] States that she developed a low grade fever, felt that nausea and vomiting had worsened. During hospitalization she was found to have elevated LFTs and imaging with dilated intrahepatic duct and cystic duct on CT, [place] and mild GB wall thickening. MRCp with mild biliary dilatation with stricture involving the mid CBD, no definite choledocolithiasis or mass visualized, recommended ERCP [facility] started on cipro and flagyl prior to transfer. AST 280, ALT 321 and ALk phos 194 on admission and continued to trend up. [facility] was attempted for possible axios stent placement, but unable to pass upper esophageal sphincter due to severe  cervical stenosis, IR consulted for perc chole tube, placed on 5/4. LFTs trended down thereafter with Alk Phos 142, AST 21 and ALT 88 on 5/5. T bili peaked to 2.3 on 5/2 but returned to normal on 5/5 at 0.7.  Patient's niece/caregiver who helps provide history states that patient has done well since discharge home. She tells me that patient was disoriented during her hospital stay and due to her age, medical staff refused to proceed with cholecystectomy. She states that pts appetite is still lower, nausea is under control with small portions and avoiding certain foods such as heavy, greasy, fatty foods. Denies any abdominal pain. Having a BM mostly daily if she eats prunes. They are changing the dressing as instructed. Pts caregiver is a nurse so she is being very cautious and watching for any signs of infection. Pt tells me she feels she Is doing fairly well, though the drainage bag makes it somewhat difficult for her to get around and to play the piano at church. She is open to the possibility of having her gallbladder removed if this is an option as she is wary about having a biliary drain indefinitely.   Last Colonoscopy:never Last Endoscopy:never  Past Medical History:  Diagnosis Date   A-fib ([facility])    Arthritis    Chronic anemia    GERD (gastroesophageal reflux disease)    PAF (paroxysmal atrial fibrillation) ([facility])    No anticoagulation due to chronic anemia    Past Surgical History:  Procedure Laterality Date   ABDOMINAL HYSTERECTOMY     CATARACT EXTRACTION W/PHACO Right 01/04/2016   Procedure: CATARACT EXTRACTION  PHACO AND INTRAOCULAR LENS PLACEMENT (IOC);  Surgeon: Tonny Branch, MD;  Location: [facility];  Service: Ophthalmology;  Laterality: Right;  CDE 9.06   CATARACT EXTRACTION W/PHACO Left 01/21/2016   Procedure: CATARACT EXTRACTION PHACO AND INTRAOCULAR LENS PLACEMENT (IOC);  Surgeon: Tonny Branch, MD;  Location: [facility];  Service: Ophthalmology;  Laterality: Left;  CDE: 12.65   HIP  ARTHROPLASTY Left 03/12/2020   Procedure: ARTHROPLASTY BIPOLAR HIP (HEMIARTHROPLASTY);  Surgeon: Carole Civil, MD;  Location: [facility];  Service: Orthopedics;  Laterality: Left;   TONSILLECTOMY      Current Outpatient Medications  Medication Sig Dispense Refill   acetaminophen (TYLENOL) 160 MG/5ML suspension Take 320 mg by mouth every 6 (six) hours as needed (pain/headache.).     calcium carbonate (TUMS - DOSED IN MG ELEMENTAL CALCIUM) 500 MG chewable tablet Chew 1 tablet by mouth 3 (three) times daily as needed for indigestion or heartburn.     Calcium-Vitamin D-Vitamin K (VIACTIV CALCIUM PLUS D) 650-12.5-40 MG-MCG-MCG CHEW Chew 1 tablet by mouth 2 (two) times daily after a meal.     diltiazem (CARDIZEM) 60 MG tablet Take 60 mg by mouth in the morning. Crushed and mixed with applesauce     Ginger, Zingiber officinalis, (GINGER ROOT PO) Take 1 tablet by mouth in the morning and at bedtime. Ginger Chew with Lemon     Misc Natural Products (ELDERBERRY IMMUNE COMPLEX) CHEW Chew 1 tablet by mouth in the morning and at bedtime. Sambucus Elderberry     Multiple Vitamins-Minerals (PRESERVISION AREDS 2 PO) Take 1 tablet by mouth 2 (two) times daily after a meal. Chewable     Propylene Glycol (SYSTANE BALANCE) 0.6 % SOLN Place 1 drop into both eyes in the morning.     trolamine salicylate (ASPERCREME) 10 % cream Apply 1 application. topically at bedtime.     No current facility-administered medications for this visit.    Allergies as of 02/07/2022 - Review Complete 02/07/2022  Allergen Reaction Noted   Penicillins Other (See Comments) 11/21/2019    Family History  Problem Relation Age of Onset   Dementia Mother     Social History   Socioeconomic History   Marital status: Widowed    Spouse name: Not on file   Number of children: Not on file   Years of education: Not on file   Highest education level: Not on file  Occupational History   Not on file  Tobacco Use   Smoking status:  Never   Smokeless tobacco: Never  Substance and Sexual Activity   Alcohol use: No   Drug use: No   Sexual activity: Never    Birth control/protection: Surgical  Other Topics Concern   Not on file  Social History Narrative   Not on file   Social Determinants of Health   Financial Resource Strain: Not on file  Food Insecurity: Not on file  Transportation Needs: Not on file  Physical Activity: Not on file  Stress: Not on file  Social Connections: Not on file   Review of systems General: negative for malaise, night sweats, fever, chills, weight loss Neck: Negative for lumps, goiter, pain and significant neck swelling Resp: Negative for cough, wheezing, dyspnea at rest CV: Negative for chest pain, leg swelling, palpitations, orthopnea GI: denies melena, hematochezia, diarrhea, dysphagia, odyonophagia, early satiety or unintentional weight loss. +nausea +vomiting +constipation MSK: Negative for joint pain or swelling, back pain, and muscle pain. Derm: Negative for itching or rash Psych: Denies depression, anxiety, memory loss, confusion.  No homicidal or suicidal ideation.  Heme: Negative for prolonged bleeding, bruising easily, and swollen nodes. Endocrine: Negative for cold or heat intolerance, polyuria, polydipsia and goiter. Neuro: negative for tremor, gait imbalance, syncope and seizures. The remainder of the review of systems is noncontributory.  Physical Exam: BP (!) 167/72 (BP Location: Right Arm, Patient Position: Sitting, Cuff Size: Normal)   Pulse 92   Temp 98.7 F (37.1 C) (Oral)   Ht 5' 1"  (1.549 m)   Wt 128 lb 11.2 oz (58.4 kg)   BMI 24.32 kg/m  General:   Alert and oriented. No distress noted. Pleasant and cooperative.  Head:  Normocephalic and atraumatic. Eyes:  Conjuctiva clear without scleral icterus. Mouth:  Oral mucosa pink and moist. Good dentition. No lesions. Heart: Normal rate and rhythm, s1 and s2 heart sounds present.  Lungs: Clear lung sounds in  all lobes. Respirations equal and unlabored. Abdomen:  +BS, soft, non-tender and non-distended. No rebound or guarding. No HSM or masses noted. Biliary drain site noted to RUQ, skin intact and without erythema or drainage Derm: No palmar erythema or jaundice Msk:  Symmetrical without gross deformities. Normal posture. Extremities:  Without edema. Neurologic:  Alert and  oriented x4 Psych:  Alert and cooperative. Normal mood and affect.  Invalid input(s): 6 MONTHS   ASSESSMENT: Joyce Hogan is a 88 y.o. female presenting today for follow up after recent admission with concern for cholecystitis and dilated CBD.   Recent admission on 5/1 to [facility] with worsening abdominal pain/nausea/vomiting, found to have elevated LFTs (AST 280, ALT 321 and ALk phos 194) and imaging with dilated intrahepatic duct and cystic duct on CT, [place] with sludge and mild GB wall thickening. MRCP with mild biliary dilatation with stricture involving the mid CBD, no definite choledocolithiasis or mass visualized, recommended ERCP for which pt was transferred to [facility] where EUS/ERCP were attempted but unsuccessful due to inability to pass upper esophageal sphincter secondary to severe cervical stenosis. Perc chole tube placed on 5/4 and LFTs trended down thereafter. Most recent LFTs on 5/5 with Alk Phos 142, AST 21 and ALT 88 and T bili WNL.   Patient overall doing well. States nausea has been minimal, appetite is good though she is still trying to avoid greasy, spicy foods. They are concerned about indefinite presence of biliary tube. I discussed the possibility of second opinion regarding cholecystectomy with the patient and caregivers in depth. We discussed risks vs benefits, as well as quality of life. Despite patient's advanced age, she is in overall relatively good health. 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 She desires a second opinion to discuss possibility of having gallbladder removed as she feels that the  benefits for her ultimately outweigh the risks in regards to her quality of life. I discussed that I do not know if cholecystectomy would be attempted at her age, but I can refer them to local surgical associates to discuss this. We will recheck CBC and CMP to evaluate blood counts and LFTs, especially given that ERCP was not able to be completed previously. I will also discuss case with Dr. Laural Golden for any further recommendations regarding dilated intrahepatic and cystic ducts, as well as stricture of mid CBD. She should continue with drain care as instructed during her recent admission. Should also continue with current diet as she is tolerating this well. She is aware of signs to look for such as fevers, chills, redness or purulent drainage around drain site, as these are  signs of possible infection.   Constipation is well controlled with eating prunes daily, should continue current regimen with good results.   PLAN:  Continue with current diet 2. Continue with drain care as previously instructed 3. Referral to [place] for second opinion of cholecystectomy 4. Repeat CBC and CMP  All questions were answered, patient verbalized understanding and is in agreement with plan as outlined above.   Follow Up: TBD  Sadat Sliwa L. Alver Sorrow, MSN, APRN, AGNP-C Adult-Gerontology Nurse Practitioner [facility]

## 2022-02-07 NOTE — Patient Instructions (Signed)
We will check some basic labs today to ensure that liver function remains normal ?Please continue with biliary drainage care as [facility] ?We will get you referred to local surgery group for second opinion on possible cholecystectomy  ?Please watch for signs of infection to include redness around drainage site, fevers, chills, worsening of abdominal pain, nausea or vomiting. ?I will discuss case further with Dr. Laural Golden for any other recommendations.  ?

## 2022-02-08 LAB — COMPREHENSIVE METABOLIC PANEL
AG Ratio: 1.3 (calc) (ref 1.0–2.5)
ALT: 16 U/L (ref 6–29)
AST: 13 U/L (ref 10–35)
Albumin: 3.9 g/dL (ref 3.6–5.1)
Alkaline phosphatase (APISO): 88 U/L (ref 37–153)
BUN: 11 mg/dL (ref 7–25)
CO2: 28 mmol/L (ref 20–32)
Calcium: 10.1 mg/dL (ref 8.6–10.4)
Chloride: 103 mmol/L (ref 98–110)
Creat: 0.82 mg/dL (ref 0.60–0.95)
Globulin: 2.9 g/dL (calc) (ref 1.9–3.7)
Glucose, Bld: 99 mg/dL (ref 65–99)
Potassium: 4.6 mmol/L (ref 3.5–5.3)
Sodium: 141 mmol/L (ref 135–146)
Total Bilirubin: 0.4 mg/dL (ref 0.2–1.2)
Total Protein: 6.8 g/dL (ref 6.1–8.1)

## 2022-02-08 LAB — CBC
HCT: 35.9 % (ref 35.0–45.0)
Hemoglobin: 11.9 g/dL (ref 11.7–15.5)
MCH: 30.7 pg (ref 27.0–33.0)
MCHC: 33.1 g/dL (ref 32.0–36.0)
MCV: 92.8 fL (ref 80.0–100.0)
MPV: 10.4 fL (ref 7.5–12.5)
Platelets: 305 10*3/uL (ref 140–400)
RBC: 3.87 10*6/uL (ref 3.80–5.10)
RDW: 12.5 % (ref 11.0–15.0)
WBC: 8.5 10*3/uL (ref 3.8–10.8)

## 2022-02-14 DIAGNOSIS — R7989 Other specified abnormal findings of blood chemistry: Secondary | ICD-10-CM | POA: Insufficient documentation

## 2022-02-14 DIAGNOSIS — K819 Cholecystitis, unspecified: Secondary | ICD-10-CM | POA: Diagnosis not present

## 2022-02-14 DIAGNOSIS — K810 Acute cholecystitis: Secondary | ICD-10-CM | POA: Insufficient documentation

## 2022-02-15 DIAGNOSIS — I10 Essential (primary) hypertension: Secondary | ICD-10-CM | POA: Diagnosis not present

## 2022-02-15 DIAGNOSIS — Z48815 Encounter for surgical aftercare following surgery on the digestive system: Secondary | ICD-10-CM | POA: Diagnosis not present

## 2022-02-15 DIAGNOSIS — Z434 Encounter for attention to other artificial openings of digestive tract: Secondary | ICD-10-CM | POA: Diagnosis not present

## 2022-02-15 DIAGNOSIS — K819 Cholecystitis, unspecified: Secondary | ICD-10-CM | POA: Diagnosis not present

## 2022-02-15 DIAGNOSIS — Z9181 History of falling: Secondary | ICD-10-CM | POA: Diagnosis not present

## 2022-02-15 DIAGNOSIS — M4802 Spinal stenosis, cervical region: Secondary | ICD-10-CM | POA: Diagnosis not present

## 2022-02-15 DIAGNOSIS — K222 Esophageal obstruction: Secondary | ICD-10-CM | POA: Diagnosis not present

## 2022-02-18 DIAGNOSIS — Z434 Encounter for attention to other artificial openings of digestive tract: Secondary | ICD-10-CM | POA: Diagnosis not present

## 2022-02-18 DIAGNOSIS — K819 Cholecystitis, unspecified: Secondary | ICD-10-CM | POA: Diagnosis not present

## 2022-02-23 DIAGNOSIS — M4802 Spinal stenosis, cervical region: Secondary | ICD-10-CM | POA: Diagnosis not present

## 2022-02-23 DIAGNOSIS — K819 Cholecystitis, unspecified: Secondary | ICD-10-CM | POA: Diagnosis not present

## 2022-02-23 DIAGNOSIS — Z9181 History of falling: Secondary | ICD-10-CM | POA: Diagnosis not present

## 2022-02-23 DIAGNOSIS — I10 Essential (primary) hypertension: Secondary | ICD-10-CM | POA: Diagnosis not present

## 2022-02-23 DIAGNOSIS — Z434 Encounter for attention to other artificial openings of digestive tract: Secondary | ICD-10-CM | POA: Diagnosis not present

## 2022-02-23 DIAGNOSIS — K222 Esophageal obstruction: Secondary | ICD-10-CM | POA: Diagnosis not present

## 2022-02-23 DIAGNOSIS — Z48815 Encounter for surgical aftercare following surgery on the digestive system: Secondary | ICD-10-CM | POA: Diagnosis not present

## 2022-02-24 DIAGNOSIS — K819 Cholecystitis, unspecified: Secondary | ICD-10-CM | POA: Diagnosis not present

## 2022-03-01 ENCOUNTER — Ambulatory Visit (INDEPENDENT_AMBULATORY_CARE_PROVIDER_SITE_OTHER): Payer: Medicare Other | Admitting: Gastroenterology

## 2022-03-02 DIAGNOSIS — I739 Peripheral vascular disease, unspecified: Secondary | ICD-10-CM | POA: Diagnosis not present

## 2022-03-02 DIAGNOSIS — L600 Ingrowing nail: Secondary | ICD-10-CM | POA: Diagnosis not present

## 2022-03-02 DIAGNOSIS — M79671 Pain in right foot: Secondary | ICD-10-CM | POA: Diagnosis not present

## 2022-03-02 DIAGNOSIS — L03031 Cellulitis of right toe: Secondary | ICD-10-CM | POA: Diagnosis not present

## 2022-03-02 DIAGNOSIS — L03032 Cellulitis of left toe: Secondary | ICD-10-CM | POA: Diagnosis not present

## 2022-03-02 DIAGNOSIS — M79672 Pain in left foot: Secondary | ICD-10-CM | POA: Diagnosis not present

## 2022-03-03 ENCOUNTER — Ambulatory Visit (INDEPENDENT_AMBULATORY_CARE_PROVIDER_SITE_OTHER): Payer: Medicare Other | Admitting: Gastroenterology

## 2022-03-03 ENCOUNTER — Encounter (INDEPENDENT_AMBULATORY_CARE_PROVIDER_SITE_OTHER): Payer: Self-pay | Admitting: Gastroenterology

## 2022-03-03 VITALS — BP 144/82 | HR 84 | Temp 98.4°F | Ht 62.0 in | Wt 132.1 lb

## 2022-03-03 DIAGNOSIS — K810 Acute cholecystitis: Secondary | ICD-10-CM

## 2022-03-03 NOTE — Patient Instructions (Signed)
I'm glad you are feeling better! Let's continue to do low fat diet with avoidance of greasy/fried foods to help prevent irritation of the gallbladder I will follow up on the referral to [place] [facility] in regards to second opinion on having gallbladder removed We can do referral to IR for possible percutaneous dilation of the CBD stricture she has, if you guys decide you would like to go ahead with this we can, or we can wait until after she is evaluated by [facility] surgery group.  Please let me know if she has any worsening symptoms in the mean time  Will determine follow up based on surgical referral and possible IR referral.   Please let me know if you have not heard from [place] [facility] in the next 1-2 weeks

## 2022-03-03 NOTE — Progress Notes (Unsigned)
Referring Provider: Neale Burly, MD Primary Care Physician:  Neale Burly, MD Primary GI Physician: Jenetta Downer  Chief Complaint  Patient presents with   Hospitalization Follow-up    Patient here today due to a recent hospital follow up from 01/25/2022 due to Cholecystitis. She had a biliary drain removed a week ago and was told to follow up.    HPI:   Joyce Hogan is a 88 y.o. female with past medical history of  A fib, arthritis, chronic anemia, GERD.  Patient presenting today for follow up of cholecystitis after replacement of biliary drain last week.   History: Last seen 02/07/22, at that time, Patient presenting for hospital follow up on 5/1 for worsening of nausea and vomiting. Stated that she developed a low grade fever, felt that nausea and vomiting had worsened. During hospitalization she was found to have elevated LFTs and imaging with dilated intrahepatic duct and cystic duct on CT, [place] with sludge and mild GB wall thickening. MRCP with mild biliary dilatation with 53m stricture involving the mid CBD, no definite choledocolithiasis or mass visualized, recommended ERCP [facility] started on cipro and flagyl prior to transfer. AST 280, ALT 321 and ALk phos 194 on admission and continued to trend up. [facility]ERCP was attempted for possible axios stent placement, but unable to pass upper esophageal sphincter due to severe cervical stenosis, IR consulted for perc chole tube, placed on 5/4. LFTs trended down thereafter with Alk Phos 142, AST 21 and ALT 88 on 5/5. T bili peaked to 2.3 on 5/2 but returned to normal on 5/5 at 0.7. nausea was under control at that time, appetite okay and she was avoiding greasy, fried, spicy foods. Patient and caregiver were open to the possibility of having gallbladder removed if this was an option as she is wary about having a biliary drain indefinitely.   CBC and CMP were checked, both WNL. patient was  referred to [facility] for second opinion of possible cholecystectomy. Case was discussed with Dr. RLaural Goldenregarding CBD stricture, he recommended that patient have referral to IR for possible percutaneous dilation of CBD   Present:  Today, patient presents for follow up.   Last Colonoscopy:never  Last Endoscopy:never  Recommendations:    Past Medical History:  Diagnosis Date   A-fib (HCC)    Arthritis    Chronic anemia    GERD (gastroesophageal reflux disease)    PAF (paroxysmal atrial fibrillation) (HCC)    No anticoagulation due to chronic anemia    Past Surgical History:  Procedure Laterality Date   ABDOMINAL HYSTERECTOMY     CATARACT EXTRACTION W/PHACO Right 01/04/2016   Procedure: CATARACT EXTRACTION PHACO AND INTRAOCULAR LENS PLACEMENT ([place];  Surgeon: KTonny Branch MD;  Location: [facility];  Service: Ophthalmology;  Laterality: Right;  CDE 9.06   CATARACT EXTRACTION W/PHACO Left 01/21/2016   Procedure: CATARACT EXTRACTION PHACO AND INTRAOCULAR LENS PLACEMENT (IOC);  Surgeon: KTonny Branch MD;  Location: [facility];  Service: Ophthalmology;  Laterality: Left;  CDE: 12.65   HIP ARTHROPLASTY Left 03/12/2020   Procedure: ARTHROPLASTY BIPOLAR HIP (HEMIARTHROPLASTY);  Surgeon: HCarole Civil MD;  Location: [facility];  Service: Orthopedics;  Laterality: Left;   TONSILLECTOMY      Current Outpatient Medications  Medication Sig Dispense Refill   acetaminophen (TYLENOL) 160 MG/5ML suspension Take 320 mg by mouth every 6 (six) hours as needed (pain/headache.).     calcium carbonate (TUMS - DOSED IN MG ELEMENTAL  CALCIUM) 500 MG chewable tablet Chew 1 tablet by mouth 2 (two) times daily. prn     Calcium-Vitamin D-Vitamin K (VIACTIV CALCIUM PLUS D) 650-12.5-40 MG-MCG-MCG CHEW Chew 1 tablet by mouth 2 (two) times daily after a meal.     diltiazem (CARDIZEM) 60 MG tablet Take 60 mg by mouth in the morning. Crushed and mixed with applesauce     Ginger, Zingiber officinalis, (GINGER ROOT PO) Take 1 tablet  by mouth in the morning and at bedtime. Ginger Chew with Lemon     Misc Natural Products (ELDERBERRY IMMUNE COMPLEX) CHEW Chew 1 tablet by mouth in the morning and at bedtime. Sambucus Elderberry     Multiple Vitamins-Minerals (PRESERVISION AREDS 2 PO) Take 1 tablet by mouth 2 (two) times daily after a meal. Chewable     Propylene Glycol (SYSTANE BALANCE) 0.6 % SOLN Place 1 drop into both eyes in the morning.     trolamine salicylate (ASPERCREME) 10 % cream Apply 1 application. topically at bedtime.     No current facility-administered medications for this visit.    Allergies as of 03/03/2022 - Review Complete 03/03/2022  Allergen Reaction Noted   Penicillins Other (See Comments) 11/21/2019    Family History  Problem Relation Age of Onset   Dementia Mother     Social History   Socioeconomic History   Marital status: Widowed    Spouse name: Not on file   Number of children: Not on file   Years of education: Not on file   Highest education level: Not on file  Occupational History   Not on file  Tobacco Use   Smoking status: Never   Smokeless tobacco: Never  Substance and Sexual Activity   Alcohol use: No   Drug use: No   Sexual activity: Never    Birth control/protection: Surgical  Other Topics Concern   Not on file  Social History Narrative   Not on file   Social Determinants of Health   Financial Resource Strain: Not on file  Food Insecurity: Not on file  Transportation Needs: Not on file  Physical Activity: Not on file  Stress: Not on file  Social Connections: Not on file    Review of systems General: negative for malaise, night sweats, fever, chills, weight los Neck: Negative for lumps, goiter, pain and significant neck swelling Resp: Negative for cough, wheezing, dyspnea at rest CV: Negative for chest pain, leg swelling, palpitations, orthopnea GI: denies melena, hematochezia, nausea, vomiting, diarrhea, constipation, dysphagia, odyonophagia, early satiety  or unintentional weight loss.  MSK: Negative for joint pain or swelling, back pain, and muscle pain. Derm: Negative for itching or rash Psych: Denies depression, anxiety, memory loss, confusion. No homicidal or suicidal ideation.  Heme: Negative for prolonged bleeding, bruising easily, and swollen nodes. Endocrine: Negative for cold or heat intolerance, polyuria, polydipsia and goiter. Neuro: negative for tremor, gait imbalance, syncope and seizures. The remainder of the review of systems is noncontributory.  Physical Exam: BP (!) 144/82 (BP Location: Right Arm, Patient Position: Sitting, Cuff Size: Large)   Pulse 84   Temp 98.4 F (36.9 C) (Oral)   Ht 5' 2"  (1.575 m)   Wt 132 lb 1.6 oz (59.9 kg)   BMI 24.16 kg/m  General:   Alert and oriented. No distress noted. Pleasant and cooperative.  Head:  Normocephalic and atraumatic. Eyes:  Conjuctiva clear without scleral icterus. Mouth:  Oral mucosa pink and moist. Good dentition. No lesions. Heart: Normal rate and rhythm, s1 and s2  heart sounds present.  Lungs: Clear lung sounds in all lobes. Respirations equal and unlabored. Abdomen:  +BS, soft, non-tender and non-distended. No rebound or guarding. No HSM or masses noted. Derm: No palmar erythema or jaundice Msk:  Symmetrical without gross deformities. Normal posture. Extremities:  Without edema. Neurologic:  Alert and  oriented x4 Psych:  Alert and cooperative. Normal mood and affect.  Invalid input(s): "6 MONTHS"   ASSESSMENT: Joyce Hogan is a 88 y.o. female presenting today    PLAN:  Await evaluation by [facility] for cholecystectomy  2. Referral to IR for percutaneous CBD dilation. 3. Low fat diet 4. Pt to make me aware of new or worsening symptoms   Follow Up: TBD  Scottie Stanish L. Alver Sorrow, MSN, APRN, AGNP-C Adult-Gerontology Nurse Practitioner [facility]

## 2022-03-04 DIAGNOSIS — I10 Essential (primary) hypertension: Secondary | ICD-10-CM | POA: Diagnosis not present

## 2022-03-04 DIAGNOSIS — K222 Esophageal obstruction: Secondary | ICD-10-CM | POA: Diagnosis not present

## 2022-03-04 DIAGNOSIS — Z434 Encounter for attention to other artificial openings of digestive tract: Secondary | ICD-10-CM | POA: Diagnosis not present

## 2022-03-04 DIAGNOSIS — K819 Cholecystitis, unspecified: Secondary | ICD-10-CM | POA: Diagnosis not present

## 2022-03-04 DIAGNOSIS — Z9181 History of falling: Secondary | ICD-10-CM | POA: Diagnosis not present

## 2022-03-04 DIAGNOSIS — M4802 Spinal stenosis, cervical region: Secondary | ICD-10-CM | POA: Diagnosis not present

## 2022-03-04 DIAGNOSIS — Z48815 Encounter for surgical aftercare following surgery on the digestive system: Secondary | ICD-10-CM | POA: Diagnosis not present

## 2022-03-07 DIAGNOSIS — M4802 Spinal stenosis, cervical region: Secondary | ICD-10-CM | POA: Diagnosis not present

## 2022-03-07 DIAGNOSIS — K222 Esophageal obstruction: Secondary | ICD-10-CM | POA: Diagnosis not present

## 2022-03-07 DIAGNOSIS — I10 Essential (primary) hypertension: Secondary | ICD-10-CM | POA: Diagnosis not present

## 2022-03-07 DIAGNOSIS — Z48815 Encounter for surgical aftercare following surgery on the digestive system: Secondary | ICD-10-CM | POA: Diagnosis not present

## 2022-03-07 DIAGNOSIS — Z434 Encounter for attention to other artificial openings of digestive tract: Secondary | ICD-10-CM | POA: Diagnosis not present

## 2022-03-07 DIAGNOSIS — Z9181 History of falling: Secondary | ICD-10-CM | POA: Diagnosis not present

## 2022-03-07 DIAGNOSIS — K819 Cholecystitis, unspecified: Secondary | ICD-10-CM | POA: Diagnosis not present

## 2022-03-08 DIAGNOSIS — M4802 Spinal stenosis, cervical region: Secondary | ICD-10-CM | POA: Diagnosis not present

## 2022-03-08 DIAGNOSIS — Z9181 History of falling: Secondary | ICD-10-CM | POA: Diagnosis not present

## 2022-03-08 DIAGNOSIS — Z48815 Encounter for surgical aftercare following surgery on the digestive system: Secondary | ICD-10-CM | POA: Diagnosis not present

## 2022-03-08 DIAGNOSIS — Z434 Encounter for attention to other artificial openings of digestive tract: Secondary | ICD-10-CM | POA: Diagnosis not present

## 2022-03-08 DIAGNOSIS — K222 Esophageal obstruction: Secondary | ICD-10-CM | POA: Diagnosis not present

## 2022-03-08 DIAGNOSIS — I10 Essential (primary) hypertension: Secondary | ICD-10-CM | POA: Diagnosis not present

## 2022-03-08 DIAGNOSIS — K819 Cholecystitis, unspecified: Secondary | ICD-10-CM | POA: Diagnosis not present

## 2022-03-16 DIAGNOSIS — Z434 Encounter for attention to other artificial openings of digestive tract: Secondary | ICD-10-CM | POA: Diagnosis not present

## 2022-03-16 DIAGNOSIS — M4802 Spinal stenosis, cervical region: Secondary | ICD-10-CM | POA: Diagnosis not present

## 2022-03-16 DIAGNOSIS — Z9181 History of falling: Secondary | ICD-10-CM | POA: Diagnosis not present

## 2022-03-16 DIAGNOSIS — K222 Esophageal obstruction: Secondary | ICD-10-CM | POA: Diagnosis not present

## 2022-03-16 DIAGNOSIS — K819 Cholecystitis, unspecified: Secondary | ICD-10-CM | POA: Diagnosis not present

## 2022-03-16 DIAGNOSIS — I10 Essential (primary) hypertension: Secondary | ICD-10-CM | POA: Diagnosis not present

## 2022-03-16 DIAGNOSIS — Z48815 Encounter for surgical aftercare following surgery on the digestive system: Secondary | ICD-10-CM | POA: Diagnosis not present

## 2022-03-21 DIAGNOSIS — Z9181 History of falling: Secondary | ICD-10-CM | POA: Diagnosis not present

## 2022-03-21 DIAGNOSIS — Z434 Encounter for attention to other artificial openings of digestive tract: Secondary | ICD-10-CM | POA: Diagnosis not present

## 2022-03-21 DIAGNOSIS — K819 Cholecystitis, unspecified: Secondary | ICD-10-CM | POA: Diagnosis not present

## 2022-03-21 DIAGNOSIS — K222 Esophageal obstruction: Secondary | ICD-10-CM | POA: Diagnosis not present

## 2022-03-21 DIAGNOSIS — M4802 Spinal stenosis, cervical region: Secondary | ICD-10-CM | POA: Diagnosis not present

## 2022-03-21 DIAGNOSIS — Z48815 Encounter for surgical aftercare following surgery on the digestive system: Secondary | ICD-10-CM | POA: Diagnosis not present

## 2022-03-21 DIAGNOSIS — I10 Essential (primary) hypertension: Secondary | ICD-10-CM | POA: Diagnosis not present

## 2022-03-22 DIAGNOSIS — K819 Cholecystitis, unspecified: Secondary | ICD-10-CM | POA: Diagnosis not present

## 2022-03-22 DIAGNOSIS — Z434 Encounter for attention to other artificial openings of digestive tract: Secondary | ICD-10-CM | POA: Diagnosis not present

## 2022-03-22 DIAGNOSIS — Z48815 Encounter for surgical aftercare following surgery on the digestive system: Secondary | ICD-10-CM | POA: Diagnosis not present

## 2022-03-22 DIAGNOSIS — I10 Essential (primary) hypertension: Secondary | ICD-10-CM | POA: Diagnosis not present

## 2022-03-22 DIAGNOSIS — K222 Esophageal obstruction: Secondary | ICD-10-CM | POA: Diagnosis not present

## 2022-03-22 DIAGNOSIS — Z9181 History of falling: Secondary | ICD-10-CM | POA: Diagnosis not present

## 2022-03-22 DIAGNOSIS — M4802 Spinal stenosis, cervical region: Secondary | ICD-10-CM | POA: Diagnosis not present

## 2022-04-01 DIAGNOSIS — M4802 Spinal stenosis, cervical region: Secondary | ICD-10-CM | POA: Diagnosis not present

## 2022-04-01 DIAGNOSIS — Z9181 History of falling: Secondary | ICD-10-CM | POA: Diagnosis not present

## 2022-04-01 DIAGNOSIS — I10 Essential (primary) hypertension: Secondary | ICD-10-CM | POA: Diagnosis not present

## 2022-04-01 DIAGNOSIS — K222 Esophageal obstruction: Secondary | ICD-10-CM | POA: Diagnosis not present

## 2022-04-01 DIAGNOSIS — Z48815 Encounter for surgical aftercare following surgery on the digestive system: Secondary | ICD-10-CM | POA: Diagnosis not present

## 2022-04-01 DIAGNOSIS — Z434 Encounter for attention to other artificial openings of digestive tract: Secondary | ICD-10-CM | POA: Diagnosis not present

## 2022-04-01 DIAGNOSIS — K819 Cholecystitis, unspecified: Secondary | ICD-10-CM | POA: Diagnosis not present

## 2022-04-02 DIAGNOSIS — Z9181 History of falling: Secondary | ICD-10-CM | POA: Diagnosis not present

## 2022-04-02 DIAGNOSIS — M4802 Spinal stenosis, cervical region: Secondary | ICD-10-CM | POA: Diagnosis not present

## 2022-04-02 DIAGNOSIS — K819 Cholecystitis, unspecified: Secondary | ICD-10-CM | POA: Diagnosis not present

## 2022-04-02 DIAGNOSIS — K222 Esophageal obstruction: Secondary | ICD-10-CM | POA: Diagnosis not present

## 2022-04-02 DIAGNOSIS — Z48815 Encounter for surgical aftercare following surgery on the digestive system: Secondary | ICD-10-CM | POA: Diagnosis not present

## 2022-04-02 DIAGNOSIS — Z434 Encounter for attention to other artificial openings of digestive tract: Secondary | ICD-10-CM | POA: Diagnosis not present

## 2022-04-02 DIAGNOSIS — I10 Essential (primary) hypertension: Secondary | ICD-10-CM | POA: Diagnosis not present

## 2022-04-11 ENCOUNTER — Telehealth (INDEPENDENT_AMBULATORY_CARE_PROVIDER_SITE_OTHER): Payer: Self-pay | Admitting: *Deleted

## 2022-04-11 NOTE — Telephone Encounter (Signed)
Spoke to [place], referral coordinator @ [facility] on 03/31/22 & 04/11/22 checking status of referral, Lattie Haw has left several message for patient to call her to schedule apt with  no response from patient

## 2022-04-12 DIAGNOSIS — I10 Essential (primary) hypertension: Secondary | ICD-10-CM | POA: Diagnosis not present

## 2022-04-12 DIAGNOSIS — M818 Other osteoporosis without current pathological fracture: Secondary | ICD-10-CM | POA: Diagnosis not present

## 2022-04-12 DIAGNOSIS — K819 Cholecystitis, unspecified: Secondary | ICD-10-CM | POA: Diagnosis not present

## 2022-04-12 DIAGNOSIS — Z131 Encounter for screening for diabetes mellitus: Secondary | ICD-10-CM | POA: Diagnosis not present

## 2022-04-12 DIAGNOSIS — K220 Achalasia of cardia: Secondary | ICD-10-CM | POA: Diagnosis not present

## 2022-04-12 DIAGNOSIS — Z0000 Encounter for general adult medical examination without abnormal findings: Secondary | ICD-10-CM | POA: Diagnosis not present

## 2022-04-12 DIAGNOSIS — M8000XD Age-related osteoporosis with current pathological fracture, unspecified site, subsequent encounter for fracture with routine healing: Secondary | ICD-10-CM | POA: Diagnosis not present

## 2022-04-25 DIAGNOSIS — M818 Other osteoporosis without current pathological fracture: Secondary | ICD-10-CM | POA: Diagnosis not present

## 2022-04-25 DIAGNOSIS — I10 Essential (primary) hypertension: Secondary | ICD-10-CM | POA: Diagnosis not present

## 2022-05-02 DIAGNOSIS — I739 Peripheral vascular disease, unspecified: Secondary | ICD-10-CM | POA: Diagnosis not present

## 2022-05-02 DIAGNOSIS — L03032 Cellulitis of left toe: Secondary | ICD-10-CM | POA: Diagnosis not present

## 2022-05-02 DIAGNOSIS — L600 Ingrowing nail: Secondary | ICD-10-CM | POA: Diagnosis not present

## 2022-05-02 DIAGNOSIS — M79671 Pain in right foot: Secondary | ICD-10-CM | POA: Diagnosis not present

## 2022-05-02 DIAGNOSIS — M79672 Pain in left foot: Secondary | ICD-10-CM | POA: Diagnosis not present

## 2022-05-02 DIAGNOSIS — L03031 Cellulitis of right toe: Secondary | ICD-10-CM | POA: Diagnosis not present

## 2022-05-04 ENCOUNTER — Other Ambulatory Visit: Payer: Self-pay | Admitting: *Deleted

## 2022-05-04 NOTE — Patient Outreach (Signed)
  Care Coordination   05/04/2022 Name: Joyce Hogan MRN: [id number] DOB: [birth year]   Care Coordination Outreach Attempts:  An unsuccessful telephone outreach was attempted today to offer the patient information about available care coordination services as a benefit of their health plan.   Follow Up Plan:  Additional outreach attempts will be made to offer the patient care coordination information and services.   Encounter Outcome:  No Answer  Care Coordination Interventions Activated:  Yes   Care Coordination Interventions:  No, not indicated    Valente David, RN, MSN, [place] [facility] [phone]

## 2022-05-06 ENCOUNTER — Other Ambulatory Visit: Payer: Self-pay | Admitting: *Deleted

## 2022-05-06 ENCOUNTER — Encounter: Payer: Self-pay | Admitting: *Deleted

## 2022-05-06 NOTE — Patient Outreach (Signed)
  Care Coordination   Initial Visit Note   05/06/2022 Name: Joyce Hogan MRN: [id number] DOB: 10-Oct-1926  Joyce Hogan is a 88 y.o. year old female who sees Joyce Hogan for primary care. I spoke with  Joyce Hogan by phone today  What matters to the patients health and wellness today?  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  [place], she has 24/7 supervision and support from several members of her family.    [name] also available today during outreach, denies any concern or needs.  About a month ago, member had trouble with gall bladder, drain was inserted for a period of time.  This has since been removed, per niece, member is back to her baseline.  State AWV was completed a couple weeks ago during last PCP visit.     SDOH assessments and interventions completed:  Yes  SDOH Interventions Today    Flowsheet Row Most Recent Value  SDOH Interventions   Food Insecurity Interventions Intervention Not Indicated  Housing Interventions Intervention Not Indicated  Transportation Interventions Intervention Not Indicated        Care Coordination Interventions Activated:  Yes  Care Coordination Interventions:  Yes, provided   Follow up plan: No further intervention required.   Encounter Outcome:  Pt. Visit Completed   Joyce Hogan, Joyce Hogan, [facility] Care Coordinator [phone]

## 2022-05-23 ENCOUNTER — Telehealth (INDEPENDENT_AMBULATORY_CARE_PROVIDER_SITE_OTHER): Payer: Self-pay

## 2022-05-23 NOTE — Telephone Encounter (Signed)
Patient [name] called today saying patient is having the issues again with the mid abdominal pain, nausea , bloating , reflux and vomited on Friday just the once. Symptoms ongoing for the last week. Patient had the drain removed 6-8 weeks ago.Patient is taking tums prn.

## 2022-05-24 NOTE — Telephone Encounter (Signed)
Niece Arbie Cookey made aware of all.

## 2022-06-06 ENCOUNTER — Encounter (INDEPENDENT_AMBULATORY_CARE_PROVIDER_SITE_OTHER): Payer: Medicare Other | Admitting: Ophthalmology

## 2022-06-15 ENCOUNTER — Encounter (INDEPENDENT_AMBULATORY_CARE_PROVIDER_SITE_OTHER): Payer: Medicare Other | Admitting: Ophthalmology

## 2022-06-22 ENCOUNTER — Encounter (INDEPENDENT_AMBULATORY_CARE_PROVIDER_SITE_OTHER): Payer: Medicare Other | Admitting: Ophthalmology

## 2022-06-22 DIAGNOSIS — H353134 Nonexudative age-related macular degeneration, bilateral, advanced atrophic with subfoveal involvement: Secondary | ICD-10-CM | POA: Diagnosis not present

## 2022-06-22 DIAGNOSIS — H43813 Vitreous degeneration, bilateral: Secondary | ICD-10-CM

## 2022-07-11 DIAGNOSIS — L600 Ingrowing nail: Secondary | ICD-10-CM | POA: Diagnosis not present

## 2022-07-11 DIAGNOSIS — M79672 Pain in left foot: Secondary | ICD-10-CM | POA: Diagnosis not present

## 2022-07-11 DIAGNOSIS — L03031 Cellulitis of right toe: Secondary | ICD-10-CM | POA: Diagnosis not present

## 2022-07-11 DIAGNOSIS — L03032 Cellulitis of left toe: Secondary | ICD-10-CM | POA: Diagnosis not present

## 2022-07-11 DIAGNOSIS — M79671 Pain in right foot: Secondary | ICD-10-CM | POA: Diagnosis not present

## 2022-07-11 DIAGNOSIS — I739 Peripheral vascular disease, unspecified: Secondary | ICD-10-CM | POA: Diagnosis not present

## 2022-07-19 DIAGNOSIS — I10 Essential (primary) hypertension: Secondary | ICD-10-CM | POA: Diagnosis not present

## 2022-07-19 DIAGNOSIS — M818 Other osteoporosis without current pathological fracture: Secondary | ICD-10-CM | POA: Diagnosis not present

## 2022-09-08 DIAGNOSIS — L600 Ingrowing nail: Secondary | ICD-10-CM | POA: Diagnosis not present

## 2022-09-08 DIAGNOSIS — M79672 Pain in left foot: Secondary | ICD-10-CM | POA: Diagnosis not present

## 2022-09-08 DIAGNOSIS — I739 Peripheral vascular disease, unspecified: Secondary | ICD-10-CM | POA: Diagnosis not present

## 2022-09-08 DIAGNOSIS — L03031 Cellulitis of right toe: Secondary | ICD-10-CM | POA: Diagnosis not present

## 2022-09-08 DIAGNOSIS — M79671 Pain in right foot: Secondary | ICD-10-CM | POA: Diagnosis not present

## 2022-09-08 DIAGNOSIS — L03032 Cellulitis of left toe: Secondary | ICD-10-CM | POA: Diagnosis not present

## 2022-11-07 DIAGNOSIS — R17 Unspecified jaundice: Secondary | ICD-10-CM | POA: Diagnosis not present

## 2022-11-07 DIAGNOSIS — I10 Essential (primary) hypertension: Secondary | ICD-10-CM | POA: Diagnosis not present

## 2022-11-08 DIAGNOSIS — R109 Unspecified abdominal pain: Secondary | ICD-10-CM | POA: Diagnosis not present

## 2022-11-08 DIAGNOSIS — R17 Unspecified jaundice: Secondary | ICD-10-CM | POA: Diagnosis not present

## 2022-11-16 DIAGNOSIS — H0288B Meibomian gland dysfunction left eye, upper and lower eyelids: Secondary | ICD-10-CM | POA: Diagnosis not present

## 2022-11-22 DIAGNOSIS — Z0000 Encounter for general adult medical examination without abnormal findings: Secondary | ICD-10-CM | POA: Diagnosis not present

## 2022-11-22 DIAGNOSIS — R17 Unspecified jaundice: Secondary | ICD-10-CM | POA: Diagnosis not present

## 2022-11-22 DIAGNOSIS — I10 Essential (primary) hypertension: Secondary | ICD-10-CM | POA: Diagnosis not present

## 2022-12-13 DIAGNOSIS — C44311 Basal cell carcinoma of skin of nose: Secondary | ICD-10-CM | POA: Diagnosis not present

## 2022-12-13 DIAGNOSIS — L821 Other seborrheic keratosis: Secondary | ICD-10-CM | POA: Diagnosis not present

## 2022-12-13 DIAGNOSIS — Z85828 Personal history of other malignant neoplasm of skin: Secondary | ICD-10-CM | POA: Diagnosis not present

## 2022-12-13 DIAGNOSIS — D485 Neoplasm of uncertain behavior of skin: Secondary | ICD-10-CM | POA: Diagnosis not present

## 2022-12-20 DIAGNOSIS — M79672 Pain in left foot: Secondary | ICD-10-CM | POA: Diagnosis not present

## 2022-12-20 DIAGNOSIS — L03031 Cellulitis of right toe: Secondary | ICD-10-CM | POA: Diagnosis not present

## 2022-12-20 DIAGNOSIS — M79671 Pain in right foot: Secondary | ICD-10-CM | POA: Diagnosis not present

## 2022-12-20 DIAGNOSIS — L03032 Cellulitis of left toe: Secondary | ICD-10-CM | POA: Diagnosis not present

## 2022-12-20 DIAGNOSIS — L600 Ingrowing nail: Secondary | ICD-10-CM | POA: Diagnosis not present

## 2022-12-20 DIAGNOSIS — I739 Peripheral vascular disease, unspecified: Secondary | ICD-10-CM | POA: Diagnosis not present

## 2023-01-17 DIAGNOSIS — C44311 Basal cell carcinoma of skin of nose: Secondary | ICD-10-CM | POA: Diagnosis not present

## 2023-03-07 DIAGNOSIS — L600 Ingrowing nail: Secondary | ICD-10-CM | POA: Diagnosis not present

## 2023-03-07 DIAGNOSIS — L03032 Cellulitis of left toe: Secondary | ICD-10-CM | POA: Diagnosis not present

## 2023-03-07 DIAGNOSIS — L03031 Cellulitis of right toe: Secondary | ICD-10-CM | POA: Diagnosis not present

## 2023-03-07 DIAGNOSIS — I739 Peripheral vascular disease, unspecified: Secondary | ICD-10-CM | POA: Diagnosis not present

## 2023-03-07 DIAGNOSIS — M79672 Pain in left foot: Secondary | ICD-10-CM | POA: Diagnosis not present

## 2023-03-07 DIAGNOSIS — M79671 Pain in right foot: Secondary | ICD-10-CM | POA: Diagnosis not present

## 2023-04-26 DIAGNOSIS — I10 Essential (primary) hypertension: Secondary | ICD-10-CM | POA: Diagnosis not present

## 2023-05-02 DIAGNOSIS — L03032 Cellulitis of left toe: Secondary | ICD-10-CM | POA: Diagnosis not present

## 2023-05-02 DIAGNOSIS — I739 Peripheral vascular disease, unspecified: Secondary | ICD-10-CM | POA: Diagnosis not present

## 2023-05-02 DIAGNOSIS — L600 Ingrowing nail: Secondary | ICD-10-CM | POA: Diagnosis not present

## 2023-05-02 DIAGNOSIS — M79671 Pain in right foot: Secondary | ICD-10-CM | POA: Diagnosis not present

## 2023-05-02 DIAGNOSIS — M79672 Pain in left foot: Secondary | ICD-10-CM | POA: Diagnosis not present

## 2023-05-02 DIAGNOSIS — L03031 Cellulitis of right toe: Secondary | ICD-10-CM | POA: Diagnosis not present

## 2023-05-20 DIAGNOSIS — N390 Urinary tract infection, site not specified: Secondary | ICD-10-CM | POA: Diagnosis not present

## 2023-05-24 DIAGNOSIS — I10 Essential (primary) hypertension: Secondary | ICD-10-CM | POA: Diagnosis not present

## 2023-06-28 DIAGNOSIS — I739 Peripheral vascular disease, unspecified: Secondary | ICD-10-CM | POA: Diagnosis not present

## 2023-06-28 DIAGNOSIS — L600 Ingrowing nail: Secondary | ICD-10-CM | POA: Diagnosis not present

## 2023-06-28 DIAGNOSIS — M79672 Pain in left foot: Secondary | ICD-10-CM | POA: Diagnosis not present

## 2023-06-28 DIAGNOSIS — L03031 Cellulitis of right toe: Secondary | ICD-10-CM | POA: Diagnosis not present

## 2023-06-28 DIAGNOSIS — L03032 Cellulitis of left toe: Secondary | ICD-10-CM | POA: Diagnosis not present

## 2023-06-28 DIAGNOSIS — M79671 Pain in right foot: Secondary | ICD-10-CM | POA: Diagnosis not present

## 2023-06-29 ENCOUNTER — Encounter (INDEPENDENT_AMBULATORY_CARE_PROVIDER_SITE_OTHER): Payer: Medicare Other | Admitting: Ophthalmology

## 2023-06-29 DIAGNOSIS — H353134 Nonexudative age-related macular degeneration, bilateral, advanced atrophic with subfoveal involvement: Secondary | ICD-10-CM

## 2023-06-29 DIAGNOSIS — H43813 Vitreous degeneration, bilateral: Secondary | ICD-10-CM

## 2023-08-03 DIAGNOSIS — I10 Essential (primary) hypertension: Secondary | ICD-10-CM | POA: Diagnosis not present

## 2023-08-03 DIAGNOSIS — Z0000 Encounter for general adult medical examination without abnormal findings: Secondary | ICD-10-CM | POA: Diagnosis not present

## 2023-08-17 DIAGNOSIS — I10 Essential (primary) hypertension: Secondary | ICD-10-CM | POA: Diagnosis not present

## 2023-08-17 DIAGNOSIS — M8000XD Age-related osteoporosis with current pathological fracture, unspecified site, subsequent encounter for fracture with routine healing: Secondary | ICD-10-CM | POA: Diagnosis not present

## 2023-09-05 DIAGNOSIS — L03031 Cellulitis of right toe: Secondary | ICD-10-CM | POA: Diagnosis not present

## 2023-09-05 DIAGNOSIS — M79672 Pain in left foot: Secondary | ICD-10-CM | POA: Diagnosis not present

## 2023-09-05 DIAGNOSIS — M79671 Pain in right foot: Secondary | ICD-10-CM | POA: Diagnosis not present

## 2023-09-05 DIAGNOSIS — L03032 Cellulitis of left toe: Secondary | ICD-10-CM | POA: Diagnosis not present

## 2023-09-05 DIAGNOSIS — I739 Peripheral vascular disease, unspecified: Secondary | ICD-10-CM | POA: Diagnosis not present

## 2023-09-05 DIAGNOSIS — L600 Ingrowing nail: Secondary | ICD-10-CM | POA: Diagnosis not present

## 2023-11-09 DIAGNOSIS — Z0000 Encounter for general adult medical examination without abnormal findings: Secondary | ICD-10-CM | POA: Diagnosis not present

## 2023-11-09 DIAGNOSIS — E7849 Other hyperlipidemia: Secondary | ICD-10-CM | POA: Diagnosis not present

## 2023-11-09 DIAGNOSIS — I10 Essential (primary) hypertension: Secondary | ICD-10-CM | POA: Diagnosis not present

## 2023-11-09 DIAGNOSIS — M818 Other osteoporosis without current pathological fracture: Secondary | ICD-10-CM | POA: Diagnosis not present

## 2023-11-10 DIAGNOSIS — Z0000 Encounter for general adult medical examination without abnormal findings: Secondary | ICD-10-CM | POA: Diagnosis not present

## 2023-11-10 DIAGNOSIS — I10 Essential (primary) hypertension: Secondary | ICD-10-CM | POA: Diagnosis not present

## 2023-11-21 DIAGNOSIS — M818 Other osteoporosis without current pathological fracture: Secondary | ICD-10-CM | POA: Diagnosis not present

## 2023-11-21 DIAGNOSIS — N1831 Chronic kidney disease, stage 3a: Secondary | ICD-10-CM | POA: Diagnosis not present

## 2023-11-21 DIAGNOSIS — E7849 Other hyperlipidemia: Secondary | ICD-10-CM | POA: Diagnosis not present

## 2023-11-21 DIAGNOSIS — I10 Essential (primary) hypertension: Secondary | ICD-10-CM | POA: Diagnosis not present

## 2023-12-26 DIAGNOSIS — I739 Peripheral vascular disease, unspecified: Secondary | ICD-10-CM | POA: Diagnosis not present

## 2023-12-26 DIAGNOSIS — L03031 Cellulitis of right toe: Secondary | ICD-10-CM | POA: Diagnosis not present

## 2023-12-26 DIAGNOSIS — M79671 Pain in right foot: Secondary | ICD-10-CM | POA: Diagnosis not present

## 2023-12-26 DIAGNOSIS — M79672 Pain in left foot: Secondary | ICD-10-CM | POA: Diagnosis not present

## 2023-12-26 DIAGNOSIS — L600 Ingrowing nail: Secondary | ICD-10-CM | POA: Diagnosis not present

## 2023-12-26 DIAGNOSIS — L03032 Cellulitis of left toe: Secondary | ICD-10-CM | POA: Diagnosis not present

## 2024-01-15 DIAGNOSIS — I10 Essential (primary) hypertension: Secondary | ICD-10-CM | POA: Diagnosis not present

## 2024-01-15 DIAGNOSIS — N1831 Chronic kidney disease, stage 3a: Secondary | ICD-10-CM | POA: Diagnosis not present

## 2024-01-25 DIAGNOSIS — I10 Essential (primary) hypertension: Secondary | ICD-10-CM | POA: Diagnosis not present

## 2024-01-25 DIAGNOSIS — L570 Actinic keratosis: Secondary | ICD-10-CM | POA: Diagnosis not present

## 2024-01-25 DIAGNOSIS — M818 Other osteoporosis without current pathological fracture: Secondary | ICD-10-CM | POA: Diagnosis not present

## 2024-01-25 DIAGNOSIS — N1831 Chronic kidney disease, stage 3a: Secondary | ICD-10-CM | POA: Diagnosis not present

## 2024-01-25 DIAGNOSIS — E7849 Other hyperlipidemia: Secondary | ICD-10-CM | POA: Diagnosis not present

## 2024-02-20 DIAGNOSIS — I10 Essential (primary) hypertension: Secondary | ICD-10-CM | POA: Diagnosis not present

## 2024-02-20 DIAGNOSIS — N1831 Chronic kidney disease, stage 3a: Secondary | ICD-10-CM | POA: Diagnosis not present

## 2024-03-21 DIAGNOSIS — I10 Essential (primary) hypertension: Secondary | ICD-10-CM | POA: Diagnosis not present

## 2024-03-21 DIAGNOSIS — N1831 Chronic kidney disease, stage 3a: Secondary | ICD-10-CM | POA: Diagnosis not present

## 2024-04-29 DIAGNOSIS — L03032 Cellulitis of left toe: Secondary | ICD-10-CM | POA: Diagnosis not present

## 2024-04-29 DIAGNOSIS — M79671 Pain in right foot: Secondary | ICD-10-CM | POA: Diagnosis not present

## 2024-04-29 DIAGNOSIS — I739 Peripheral vascular disease, unspecified: Secondary | ICD-10-CM | POA: Diagnosis not present

## 2024-04-29 DIAGNOSIS — L03031 Cellulitis of right toe: Secondary | ICD-10-CM | POA: Diagnosis not present

## 2024-04-29 DIAGNOSIS — M79672 Pain in left foot: Secondary | ICD-10-CM | POA: Diagnosis not present

## 2024-04-29 DIAGNOSIS — L600 Ingrowing nail: Secondary | ICD-10-CM | POA: Diagnosis not present

## 2024-05-15 DIAGNOSIS — N1831 Chronic kidney disease, stage 3a: Secondary | ICD-10-CM | POA: Diagnosis not present

## 2024-05-15 DIAGNOSIS — I10 Essential (primary) hypertension: Secondary | ICD-10-CM | POA: Diagnosis not present

## 2024-07-04 ENCOUNTER — Encounter (INDEPENDENT_AMBULATORY_CARE_PROVIDER_SITE_OTHER): Payer: Medicare Other | Admitting: Ophthalmology

## 2024-10-27 DEATH — deceased
